# Patient Record
Sex: Female | Born: 1970 | Race: White | Hispanic: No | Marital: Married | Smoking: Former smoker
Health system: Southern US, Community
[De-identification: ages and names within clinical notes are randomized; demographics above are authoritative.]

## PROBLEM LIST (undated history)

## (undated) DIAGNOSIS — S069X9A Unspecified intracranial injury with loss of consciousness of unspecified duration, initial encounter: Secondary | ICD-10-CM

## (undated) DIAGNOSIS — I499 Cardiac arrhythmia, unspecified: Secondary | ICD-10-CM

## (undated) DIAGNOSIS — S069XAA Unspecified intracranial injury with loss of consciousness status unknown, initial encounter: Secondary | ICD-10-CM

## (undated) DIAGNOSIS — R42 Dizziness and giddiness: Secondary | ICD-10-CM

## (undated) DIAGNOSIS — F329 Major depressive disorder, single episode, unspecified: Secondary | ICD-10-CM

## (undated) DIAGNOSIS — F419 Anxiety disorder, unspecified: Secondary | ICD-10-CM

## (undated) DIAGNOSIS — F32A Depression, unspecified: Secondary | ICD-10-CM

## (undated) HISTORY — PX: CHOLECYSTECTOMY: SHX55

## (undated) HISTORY — PX: ABDOMINAL HYSTERECTOMY: SHX81

---

## 2018-11-20 DIAGNOSIS — M7062 Trochanteric bursitis, left hip: Secondary | ICD-10-CM | POA: Diagnosis not present

## 2018-11-20 DIAGNOSIS — M25552 Pain in left hip: Secondary | ICD-10-CM | POA: Diagnosis not present

## 2018-12-02 DIAGNOSIS — F331 Major depressive disorder, recurrent, moderate: Secondary | ICD-10-CM | POA: Diagnosis not present

## 2018-12-11 DIAGNOSIS — B349 Viral infection, unspecified: Secondary | ICD-10-CM | POA: Diagnosis not present

## 2018-12-23 DIAGNOSIS — F3181 Bipolar II disorder: Secondary | ICD-10-CM | POA: Diagnosis not present

## 2018-12-25 ENCOUNTER — Other Ambulatory Visit: Payer: Self-pay

## 2018-12-25 ENCOUNTER — Emergency Department (HOSPITAL_COMMUNITY): Payer: BLUE CROSS/BLUE SHIELD

## 2018-12-25 ENCOUNTER — Encounter (HOSPITAL_COMMUNITY): Payer: Self-pay

## 2018-12-25 ENCOUNTER — Emergency Department (HOSPITAL_COMMUNITY)
Admission: EM | Admit: 2018-12-25 | Discharge: 2018-12-25 | Disposition: A | Discharge status: Home / Self Care | Payer: BLUE CROSS/BLUE SHIELD | Attending: Emergency Medicine | Admitting: Emergency Medicine

## 2018-12-25 DIAGNOSIS — Z87891 Personal history of nicotine dependence: Secondary | ICD-10-CM | POA: Insufficient documentation

## 2018-12-25 DIAGNOSIS — R0602 Shortness of breath: Secondary | ICD-10-CM | POA: Diagnosis not present

## 2018-12-25 DIAGNOSIS — Z79899 Other long term (current) drug therapy: Secondary | ICD-10-CM | POA: Insufficient documentation

## 2018-12-25 DIAGNOSIS — R0789 Other chest pain: Secondary | ICD-10-CM | POA: Diagnosis not present

## 2018-12-25 DIAGNOSIS — F329 Major depressive disorder, single episode, unspecified: Secondary | ICD-10-CM | POA: Diagnosis not present

## 2018-12-25 DIAGNOSIS — Z9049 Acquired absence of other specified parts of digestive tract: Secondary | ICD-10-CM | POA: Insufficient documentation

## 2018-12-25 DIAGNOSIS — R079 Chest pain, unspecified: Secondary | ICD-10-CM | POA: Diagnosis not present

## 2018-12-25 HISTORY — DX: Unspecified intracranial injury with loss of consciousness status unknown, initial encounter: S06.9XAA

## 2018-12-25 HISTORY — DX: Cardiac arrhythmia, unspecified: I49.9

## 2018-12-25 HISTORY — DX: Major depressive disorder, single episode, unspecified: F32.9

## 2018-12-25 HISTORY — DX: Unspecified intracranial injury with loss of consciousness of unspecified duration, initial encounter: S06.9X9A

## 2018-12-25 HISTORY — DX: Depression, unspecified: F32.A

## 2018-12-25 LAB — CBC
HCT: 47.4 % — ABNORMAL HIGH (ref 36.0–46.0)
HEMOGLOBIN: 14.6 g/dL (ref 12.0–15.0)
MCH: 28.2 pg (ref 26.0–34.0)
MCHC: 30.8 g/dL (ref 30.0–36.0)
MCV: 91.5 fL (ref 80.0–100.0)
NRBC: 0 % (ref 0.0–0.2)
Platelets: 310 10*3/uL (ref 150–400)
RBC: 5.18 MIL/uL — AB (ref 3.87–5.11)
RDW: 13.2 % (ref 11.5–15.5)
WBC: 8.3 10*3/uL (ref 4.0–10.5)

## 2018-12-25 LAB — BASIC METABOLIC PANEL
ANION GAP: 9 (ref 5–15)
BUN: 16 mg/dL (ref 6–20)
CO2: 29 mmol/L (ref 22–32)
Calcium: 9.4 mg/dL (ref 8.9–10.3)
Chloride: 104 mmol/L (ref 98–111)
Creatinine, Ser: 0.74 mg/dL (ref 0.44–1.00)
GFR calc Af Amer: >60 mL/min (ref >60)
GFR calc non Af Amer: >60 mL/min (ref >60)
Glucose, Bld: 93 mg/dL (ref 70–99)
POTASSIUM: 3.6 mmol/L (ref 3.5–5.1)
Sodium: 142 mmol/L (ref 135–145)

## 2018-12-25 LAB — POCT I-STAT TROPONIN I
TROPONIN I, POC: 0.01 ng/mL (ref 0.00–0.08)
Troponin i, poc: 0 ng/mL (ref 0.00–0.08)

## 2018-12-25 MED ORDER — ALUM & MAG HYDROXIDE-SIMETH 200-200-20 MG/5ML PO SUSP
30.0000 mL | Freq: Once | ORAL | Status: AC
  Administered 2018-12-25: 30 mL via ORAL
  Filled 2018-12-25: qty 30

## 2018-12-25 MED ORDER — LIDOCAINE VISCOUS HCL 2 % MT SOLN
15.0000 mL | Freq: Once | OROMUCOSAL | Status: AC
  Administered 2018-12-25: 15 mL via ORAL
  Filled 2018-12-25: qty 15

## 2018-12-25 MED ORDER — SODIUM CHLORIDE 0.9% FLUSH: 3.0000 mL | Freq: Once | INTRAVENOUS | Status: DC

## 2018-12-25 NOTE — ED Provider Notes (Signed)
[facility]-EMERGENCY DEPT Provider Note   CSN: [id number] Arrival date & time: 12/25/18  1123    History   Chief Complaint Chief Complaint  Patient presents with  . Chest Pain    HPI Annette Spears is a 48 y.o. female.     48 year old female with prior medical history as detailed below presents for evaluation of chest pressure.  Patient reports onset of vague chest pressure starting this morning around 9 AM.  She denies associated diaphoresis, shortness of breath, back pain, fever, or other acute complaint.  She denies prior history of cardiac disease.  She denies prior cardiac work-up.  The history is provided by the patient and medical records.  Chest Pain  Pain location:  Substernal area Pain quality: tightness   Pain radiates to:  Does not radiate Pain severity:  No pain Onset quality:  Gradual Duration:  5 hours Timing:  Constant Progression:  Unchanged Chronicity:  New Worsened by:  Nothing Ineffective treatments:  None tried   Past Medical History:  Diagnosis Date  . Depression   . Irregular heart beat   . TBI (traumatic brain injury) (HCC)     There are no active problems to display for this patient.   Past Surgical History:  Procedure Laterality Date  . ABDOMINAL HYSTERECTOMY    . CHOLECYSTECTOMY       OB History   No obstetric history on file.      Home Medications    Prior to Admission medications   Medication Sig Start Date End Date Taking? Authorizing Provider  ibuprofen (ADVIL,MOTRIN) 200 MG tablet Take 800-1,400 mg by mouth 2 (two) times daily as needed for moderate pain.   Yes [provider]  LATUDA 40 MG TABS tablet Take 40 mg by mouth. 12/02/18  Yes [provider]  PAXIL 40 MG tablet Take 40 mg by mouth daily. 12/23/18  Yes [provider]  traZODone (DESYREL) 100 MG tablet Take 100-200 mg by mouth at bedtime as needed for sleep. 12/02/18  Yes [provider]  WELLBUTRIN XL 150 MG  24 hr tablet Take 450 mg by mouth daily. 12/02/18  Yes [provider]    Family History Family History  Adopted: Yes    Social History Social History   Tobacco Use  . Smoking status: Former Smoker    Types: Cigarettes  . Smokeless tobacco: Never Used  Substance Use Topics  . Alcohol use: Not Currently  . Drug use: Not Currently     Allergies   Geodon [ziprasidone hcl]; Lithium; and Risperdal [risperidone]   Review of Systems Review of Systems  Cardiovascular: Positive for chest pain.  All other systems reviewed and are negative.    Physical Exam Updated Vital Signs BP (!) 112/53   Pulse 77   Temp 98.1 F (36.7 C) (Oral)   Resp 13   Ht 4\' 11"  (1.499 m)   Wt 131.5 kg   LMP 12/25/2018   SpO2 95%   BMI 58.57 kg/m   Physical Exam Vitals signs and nursing note reviewed.  Constitutional:      General: She is not in acute distress.    Appearance: She is well-developed.  HENT:     Head: Normocephalic and atraumatic.  Eyes:     Conjunctiva/sclera: Conjunctivae normal.     Pupils: Pupils are equal, round, and reactive to light.  Neck:     Musculoskeletal: Normal range of motion and neck supple.  Cardiovascular:     Rate and  Rhythm: Normal rate and regular rhythm.     Heart sounds: Normal heart sounds.  Pulmonary:     Effort: Pulmonary effort is normal. No respiratory distress.     Breath sounds: Normal breath sounds.  Abdominal:     General: There is no distension.     Palpations: Abdomen is soft.     Tenderness: There is no abdominal tenderness.  Musculoskeletal: Normal range of motion.        General: No deformity.  Skin:    General: Skin is warm and dry.  Neurological:     Mental Status: She is alert and oriented to person, place, and time.      ED Treatments / Results  Labs (all labs ordered are listed, but only abnormal results are displayed) Labs Reviewed  CBC - Abnormal; Notable for the following components:      Result Value    RBC 5.18 (*)    HCT 47.4 (*)    All other components within normal limits  BASIC METABOLIC PANEL  I-STAT TROPONIN, ED  POCT I-STAT TROPONIN I    EKG EKG Interpretation  Date/Time:  Monday December 25 2018 11:31:07 EDT Ventricular Rate:  73 PR Interval:    QRS Duration: 80 QT Interval:  390 QTC Calculation: 430 R Axis:   57 Text Interpretation:  Sinus rhythm Borderline T wave abnormalities Baseline wander in lead(s) V1 Confirmed by Kristine Royal [phone]) on 12/25/2018 12:03:19 PM   Radiology Dg Chest 2 View  Result Date: 12/25/2018 CLINICAL DATA:  Chest pain and shortness of breath EXAM: CHEST - 2 VIEW COMPARISON:  None. FINDINGS: Lungs are clear. Heart size and pulmonary vascularity are normal. No adenopathy. No bone lesions. IMPRESSION: No edema or consolidation. Electronically Signed   By: Bretta Bang III M.D.   On: 12/25/2018 12:27    Procedures Procedures (including critical care time)  Medications Ordered in ED Medications  sodium chloride flush (NS) 0.9 % injection 3 mL (0 mLs Intravenous Hold 12/25/18 1244)     Initial Impression / Assessment and Plan / ED Course  I have reviewed the triage vital signs and the nursing notes.  Pertinent labs & imaging results that were available during my care of the patient were reviewed by me and considered in my medical decision making (see chart for details).        MDM  Screen complete  Heart Score of 2  Is presenting for evaluation of chest discomfort that began this morning.  Initial EKG is without evidence of acute ischemia.  Initial troponin is negative.  Patient does feel improved following her initial ED evaluation.  Other screening labs obtained are without significant abnormality.  Delta troponin is negative.   BMP is still pending - Staff may have to redraw specimen.  Patient likely will be appropriate for discharge home.  Dr. Deretha Emory aware of case and pending disposition.     Final Clinical  Impressions(s) / ED Diagnoses   Final diagnoses:  Atypical chest pain    ED Discharge Orders    None       Wynetta Fines, MD 12/25/18 1600

## 2018-12-25 NOTE — ED Provider Notes (Signed)
BMP is normal.  No significant abnormalities.  Patient stable for discharge home.  Troponins x2 were negative.   Vanetta Mulders, MD 12/25/18 1640

## 2018-12-25 NOTE — Discharge Instructions (Addendum)
Please return for any problem. Follow up with your regular physician as instructed.  °

## 2018-12-25 NOTE — ED Triage Notes (Signed)
Patient c/o left chest pain and SOB since 0900 today. paitent states the pain radiates into the left arm and states her teeth even hurt.

## 2019-01-13 DIAGNOSIS — F251 Schizoaffective disorder, depressive type: Secondary | ICD-10-CM | POA: Diagnosis not present

## 2019-02-10 DIAGNOSIS — N3001 Acute cystitis with hematuria: Secondary | ICD-10-CM | POA: Diagnosis not present

## 2019-02-10 DIAGNOSIS — R300 Dysuria: Secondary | ICD-10-CM | POA: Diagnosis not present

## 2019-02-11 DIAGNOSIS — N3001 Acute cystitis with hematuria: Secondary | ICD-10-CM | POA: Diagnosis not present

## 2019-02-17 DIAGNOSIS — F251 Schizoaffective disorder, depressive type: Secondary | ICD-10-CM | POA: Diagnosis not present

## 2019-03-06 DIAGNOSIS — F331 Major depressive disorder, recurrent, moderate: Secondary | ICD-10-CM | POA: Diagnosis not present

## 2019-03-13 ENCOUNTER — Emergency Department (HOSPITAL_COMMUNITY)
Admission: EM | Admit: 2019-03-13 | Discharge: 2019-03-14 | Disposition: A | Discharge status: Other Acute Inpatient Hospital | Payer: BC Managed Care – PPO | Attending: Emergency Medicine | Admitting: Emergency Medicine

## 2019-03-13 ENCOUNTER — Encounter (HOSPITAL_COMMUNITY): Payer: Self-pay

## 2019-03-13 DIAGNOSIS — Z79899 Other long term (current) drug therapy: Secondary | ICD-10-CM | POA: Insufficient documentation

## 2019-03-13 DIAGNOSIS — Z87891 Personal history of nicotine dependence: Secondary | ICD-10-CM | POA: Diagnosis not present

## 2019-03-13 DIAGNOSIS — Z62810 Personal history of physical and sexual abuse in childhood: Secondary | ICD-10-CM | POA: Diagnosis not present

## 2019-03-13 DIAGNOSIS — Z03818 Encounter for observation for suspected exposure to other biological agents ruled out: Secondary | ICD-10-CM | POA: Diagnosis not present

## 2019-03-13 DIAGNOSIS — Z818 Family history of other mental and behavioral disorders: Secondary | ICD-10-CM | POA: Diagnosis not present

## 2019-03-13 DIAGNOSIS — Z1159 Encounter for screening for other viral diseases: Secondary | ICD-10-CM | POA: Insufficient documentation

## 2019-03-13 DIAGNOSIS — F333 Major depressive disorder, recurrent, severe with psychotic symptoms: Secondary | ICD-10-CM | POA: Diagnosis not present

## 2019-03-13 DIAGNOSIS — Z87820 Personal history of traumatic brain injury: Secondary | ICD-10-CM | POA: Diagnosis not present

## 2019-03-13 DIAGNOSIS — R45851 Suicidal ideations: Secondary | ICD-10-CM | POA: Diagnosis not present

## 2019-03-13 DIAGNOSIS — F329 Major depressive disorder, single episode, unspecified: Secondary | ICD-10-CM | POA: Insufficient documentation

## 2019-03-13 DIAGNOSIS — Z008 Encounter for other general examination: Secondary | ICD-10-CM | POA: Insufficient documentation

## 2019-03-13 DIAGNOSIS — Z888 Allergy status to other drugs, medicaments and biological substances status: Secondary | ICD-10-CM | POA: Diagnosis not present

## 2019-03-13 LAB — ETHANOL: Alcohol, Ethyl (B): <10 mg/dL (ref <10)

## 2019-03-13 LAB — CBC
HCT: 46.5 % — ABNORMAL HIGH (ref 36.0–46.0)
Hemoglobin: 15.2 g/dL — ABNORMAL HIGH (ref 12.0–15.0)
MCH: 28.8 pg (ref 26.0–34.0)
MCHC: 32.7 g/dL (ref 30.0–36.0)
MCV: 88.1 fL (ref 80.0–100.0)
Platelets: 340 10*3/uL (ref 150–400)
RBC: 5.28 MIL/uL — ABNORMAL HIGH (ref 3.87–5.11)
RDW: 13.4 % (ref 11.5–15.5)
WBC: 9.6 10*3/uL (ref 4.0–10.5)
nRBC: 0 % (ref 0.0–0.2)

## 2019-03-13 LAB — COMPREHENSIVE METABOLIC PANEL
ALT: 27 U/L (ref 0–44)
AST: 23 U/L (ref 15–41)
Albumin: 3.9 g/dL (ref 3.5–5.0)
Alkaline Phosphatase: 97 U/L (ref 38–126)
Anion gap: 9 (ref 5–15)
BUN: 18 mg/dL (ref 6–20)
CO2: 25 mmol/L (ref 22–32)
Calcium: 9.6 mg/dL (ref 8.9–10.3)
Chloride: 108 mmol/L (ref 98–111)
Creatinine, Ser: 0.95 mg/dL (ref 0.44–1.00)
GFR calc Af Amer: >60 mL/min (ref >60)
GFR calc non Af Amer: >60 mL/min (ref >60)
Glucose, Bld: 96 mg/dL (ref 70–99)
Potassium: 4.1 mmol/L (ref 3.5–5.1)
Sodium: 142 mmol/L (ref 135–145)
Total Bilirubin: 0.4 mg/dL (ref 0.3–1.2)
Total Protein: 7.5 g/dL (ref 6.5–8.1)

## 2019-03-13 LAB — ACETAMINOPHEN LEVEL: Acetaminophen (Tylenol), Serum: <10 ug/mL — ABNORMAL LOW (ref 10–30)

## 2019-03-13 LAB — SALICYLATE LEVEL: Salicylate Lvl: <7 mg/dL (ref 2.8–30.0)

## 2019-03-13 LAB — I-STAT BETA HCG BLOOD, ED (MC, WL, AP ONLY): I-stat hCG, quantitative: <5 m[IU]/mL (ref <5)

## 2019-03-13 NOTE — ED Triage Notes (Signed)
PT arrives POV for eval of SI. Pt reports the last two weeks she has been having increased AVH. Pt reports that she has been hearing voices while trying to do her homework that tell her that she is "stupid or dumb" and can't do it. Pt w/ hx of suicide attempts last one about 6 months ago. Pt reports plan of sticking hand into garbage disposal, allowing the garbage disposal to cut her hand off and then bleeding out from there." Pt alert, cooperative and pleasant in triage.

## 2019-03-14 ENCOUNTER — Encounter (HOSPITAL_COMMUNITY): Payer: Self-pay | Admitting: *Deleted

## 2019-03-14 ENCOUNTER — Inpatient Hospital Stay (HOSPITAL_COMMUNITY)
Admission: AD | Admit: 2019-03-14 | Discharge: 2019-03-17 | DRG: 885 | Disposition: A | Discharge status: Home / Self Care | Payer: BLUE CROSS/BLUE SHIELD | Source: Intra-hospital | Attending: Psychiatry | Admitting: Psychiatry

## 2019-03-14 ENCOUNTER — Other Ambulatory Visit: Payer: Self-pay

## 2019-03-14 DIAGNOSIS — Z818 Family history of other mental and behavioral disorders: Secondary | ICD-10-CM

## 2019-03-14 DIAGNOSIS — Z87891 Personal history of nicotine dependence: Secondary | ICD-10-CM | POA: Diagnosis not present

## 2019-03-14 DIAGNOSIS — Z008 Encounter for other general examination: Secondary | ICD-10-CM | POA: Diagnosis not present

## 2019-03-14 DIAGNOSIS — Z888 Allergy status to other drugs, medicaments and biological substances status: Secondary | ICD-10-CM

## 2019-03-14 DIAGNOSIS — F333 Major depressive disorder, recurrent, severe with psychotic symptoms: Secondary | ICD-10-CM | POA: Diagnosis not present

## 2019-03-14 DIAGNOSIS — Z20828 Contact with and (suspected) exposure to other viral communicable diseases: Secondary | ICD-10-CM | POA: Diagnosis not present

## 2019-03-14 DIAGNOSIS — Z79899 Other long term (current) drug therapy: Secondary | ICD-10-CM

## 2019-03-14 DIAGNOSIS — Z62810 Personal history of physical and sexual abuse in childhood: Secondary | ICD-10-CM | POA: Diagnosis present

## 2019-03-14 DIAGNOSIS — Z87820 Personal history of traumatic brain injury: Secondary | ICD-10-CM

## 2019-03-14 DIAGNOSIS — R45851 Suicidal ideations: Secondary | ICD-10-CM | POA: Diagnosis not present

## 2019-03-14 DIAGNOSIS — F332 Major depressive disorder, recurrent severe without psychotic features: Secondary | ICD-10-CM | POA: Insufficient documentation

## 2019-03-14 DIAGNOSIS — R440 Auditory hallucinations: Secondary | ICD-10-CM | POA: Diagnosis present

## 2019-03-14 DIAGNOSIS — F329 Major depressive disorder, single episode, unspecified: Secondary | ICD-10-CM | POA: Diagnosis not present

## 2019-03-14 HISTORY — DX: Anxiety disorder, unspecified: F41.9

## 2019-03-14 LAB — RAPID URINE DRUG SCREEN, HOSP PERFORMED
Amphetamines: NOT DETECTED
Barbiturates: NOT DETECTED
Benzodiazepines: NOT DETECTED
Cocaine: NOT DETECTED
Opiates: NOT DETECTED
Tetrahydrocannabinol: NOT DETECTED

## 2019-03-14 LAB — SARS CORONAVIRUS 2 BY RT PCR (HOSPITAL ORDER, PERFORMED IN ~~LOC~~ HOSPITAL LAB): SARS Coronavirus 2: NEGATIVE

## 2019-03-14 MED ORDER — HYDROXYZINE HCL 25 MG PO TABS: 25.0000 mg | ORAL_TABLET | Freq: Three times a day (TID) | ORAL | Status: DC | PRN

## 2019-03-14 MED ORDER — ACETAMINOPHEN 325 MG PO TABS
650.0000 mg | ORAL_TABLET | Freq: Four times a day (QID) | ORAL | Status: DC | PRN
  Administered 2019-03-14 – 2019-03-16 (×3): 650 mg via ORAL
  Filled 2019-03-14 (×3): qty 2

## 2019-03-14 MED ORDER — ALUM & MAG HYDROXIDE-SIMETH 200-200-20 MG/5ML PO SUSP: 30.0000 mL | ORAL | Status: DC | PRN

## 2019-03-14 MED ORDER — TRAZODONE HCL 50 MG PO TABS: 50.0000 mg | ORAL_TABLET | Freq: Every evening | ORAL | Status: DC | PRN

## 2019-03-14 MED ORDER — LURASIDONE HCL 80 MG PO TABS
80.0000 mg | ORAL_TABLET | Freq: Every day | ORAL | Status: DC
  Administered 2019-03-14 – 2019-03-16 (×3): 80 mg via ORAL
  Filled 2019-03-14 (×5): qty 1

## 2019-03-14 MED ORDER — BUPROPION HCL ER (XL) 150 MG PO TB24
450.0000 mg | ORAL_TABLET | Freq: Every day | ORAL | Status: DC
  Administered 2019-03-14 – 2019-03-17 (×4): 450 mg via ORAL
  Filled 2019-03-14 (×7): qty 3

## 2019-03-14 MED ORDER — MIRTAZAPINE 7.5 MG PO TABS
7.5000 mg | ORAL_TABLET | Freq: Every day | ORAL | Status: DC
  Administered 2019-03-14 – 2019-03-16 (×3): 7.5 mg via ORAL
  Filled 2019-03-14 (×5): qty 1

## 2019-03-14 MED ORDER — MAGNESIUM HYDROXIDE 400 MG/5ML PO SUSP: 30.0000 mL | Freq: Every day | ORAL | Status: DC | PRN

## 2019-03-14 NOTE — Tx Team (Signed)
Interdisciplinary Treatment and Diagnostic Plan Update  03/14/2019 Time of Session: 9:00am Annette Spears MRN: [id number]  Principal Diagnosis: <principal problem not specified>  Secondary Diagnoses: Active Problems:   Severe recurrent major depression without psychotic features (HCC)   Current Medications:  Current Facility-Administered Medications  Medication Dose Route Frequency Provider Last Rate Last Dose  . acetaminophen (TYLENOL) tablet 650 mg  650 mg Oral Q6H PRN Rozetta Nunnery, NP      . alum & mag hydroxide-simeth (MAALOX/MYLANTA) 200-200-20 MG/5ML suspension 30 mL  30 mL Oral Q4H PRN Lindon Romp A, NP      . hydrOXYzine (ATARAX/VISTARIL) tablet 25 mg  25 mg Oral TID PRN Lindon Romp A, NP      . magnesium hydroxide (MILK OF MAGNESIA) suspension 30 mL  30 mL Oral Daily PRN Lindon Romp A, NP      . traZODone (DESYREL) tablet 50 mg  50 mg Oral QHS PRN Rozetta Nunnery, NP       PTA Medications: Medications Prior to Admission  Medication Sig Dispense Refill Last Dose  . ibuprofen (ADVIL,MOTRIN) 200 MG tablet Take 800-1,400 mg by mouth 2 (two) times daily as needed for moderate pain.   Past Month at Unknown time  . LATUDA 60 MG TABS Take 1 tablet by mouth at bedtime.     Marland Kitchen PARoxetine (PAXIL) 20 MG tablet Take 1 tablet by mouth daily.     . traZODone (DESYREL) 100 MG tablet Take 100-200 mg by mouth at bedtime as needed for sleep.   12/24/2018 at Unknown time  . WELLBUTRIN XL 150 MG 24 hr tablet Take 450 mg by mouth daily.   12/25/2018 at Unknown time    Patient Stressors:    Patient Strengths:    Treatment Modalities: Medication Management, Group therapy, Case management,  1 to 1 session with clinician, Psychoeducation, Recreational therapy.   Physician Treatment Plan for Primary Diagnosis: <principal problem not specified> Long Term Goal(s):     Short Term Goals:    Medication Management: Evaluate patient's response, side effects, and tolerance of medication  regimen.  Therapeutic Interventions: 1 to 1 sessions, Unit Group sessions and Medication administration.  Evaluation of Outcomes: Not Met  Physician Treatment Plan for Secondary Diagnosis: Active Problems:   Severe recurrent major depression without psychotic features ([place])  Long Term Goal(s):     Short Term Goals:       Medication Management: Evaluate patient's response, side effects, and tolerance of medication regimen.  Therapeutic Interventions: 1 to 1 sessions, Unit Group sessions and Medication administration.  Evaluation of Outcomes: Not Met   RN Treatment Plan for Primary Diagnosis: <principal problem not specified> Long Term Goal(s): Knowledge of disease and therapeutic regimen to maintain health will improve  Short Term Goals: Ability to remain free from injury will improve, Ability to verbalize feelings will improve and Ability to identify and develop effective coping behaviors will improve  Medication Management: RN will administer medications as ordered by provider, will assess and evaluate patient's response and provide education to patient for prescribed medication. RN will report any adverse and/or side effects to prescribing provider.  Therapeutic Interventions: 1 on 1 counseling sessions, Psychoeducation, Medication administration, Evaluate responses to treatment, Monitor vital signs and CBGs as ordered, Perform/monitor CIWA, COWS, AIMS and Fall Risk screenings as ordered, Perform wound care treatments as ordered.  Evaluation of Outcomes: Not Met   LCSW Treatment Plan for Primary Diagnosis: <principal problem not specified> Long Term Goal(s): Safe transition to appropriate next level  of care at discharge, Engage patient in therapeutic group addressing interpersonal concerns.  Short Term Goals: Engage patient in aftercare planning with referrals and resources, Increase social support, Identify triggers associated with mental health/substance abuse issues and  Increase skills for wellness and recovery  Therapeutic Interventions: Assess for all discharge needs, 1 to 1 time with Social worker, Explore available resources and support systems, Assess for adequacy in community support network, Educate family and significant other(s) on suicide prevention, Complete Psychosocial Assessment, Interpersonal group therapy.  Evaluation of Outcomes: Not Met   Progress in Treatment: Attending groups: No. New to unit. Participating in groups: No. Taking medication as prescribed: Yes. Toleration medication: Yes. Family/Significant other contact made: No, will contact:  husband Patient understands diagnosis: Yes. Discussing patient identified problems/goals with staff: Yes. Medical problems stabilized or resolved: Yes. Denies suicidal/homicidal ideation: No. Issues/concerns per patient self-inventory: Yes.  New problem(s) identified: Yes, Describe:  financial stressors  New Short Term/Long Term Goal(s): medication management for mood stabilization; elimination of SI thoughts; development of comprehensive mental wellness/sobriety plan.  Patient Goals: "To get better"  Discharge Plan or Barriers: Expected to discharge home, will be referred to [facility] for therapy and medication management.  Reason for Continuation of Hospitalization: Anxiety Depression Suicidal ideation  Estimated Length of Stay: 5-7 days  Attendees: Patient: Annette Spears  03/14/2019 10:06 AM  Physician:  03/14/2019 10:06 AM  Nursing:  03/14/2019 10:06 AM  RN Care Manager: 03/14/2019 10:06 AM  Social Worker: Stephanie Acre, [place] 03/14/2019 10:06 AM  Recreational Therapist:  03/14/2019 10:06 AM  Other:  03/14/2019 10:06 AM  Other:  03/14/2019 10:06 AM  Other: 03/14/2019 10:06 AM    Scribe for Treatment Team: Joellen [place], [place] 03/14/2019 10:06 AM

## 2019-03-14 NOTE — ED Provider Notes (Signed)
[facility] EMERGENCY DEPARTMENT Provider Note   CSN: [id number] Arrival date & time: 03/13/19  2229    History   Chief Complaint Chief Complaint  Patient presents with  . Suicidal    HPI Annette Spears is a 48 y.o. female.     Patient with history of depression and bipolar disorder presents the emergency department with complaint of suicidal ideation.  Patient states that she wants to cut her wrists and let herself bleed.  She reports taking her medication but having increasing auditory hallucinations and depression.  She states that she last cut her wrist 2 months ago.  She states that this helps with her pain.  She denies any recent symptoms of illness including fevers, chest pain, shortness of breath, nausea, vomiting.  Denies alcohol or drug use.     Past Medical History:  Diagnosis Date  . Depression   . Irregular heart beat   . TBI (traumatic brain injury) (HCC)     There are no active problems to display for this patient.   Past Surgical History:  Procedure Laterality Date  . ABDOMINAL HYSTERECTOMY    . CHOLECYSTECTOMY       OB History   No obstetric history on file.      Home Medications    Prior to Admission medications   Medication Sig Start Date End Date Taking? Authorizing Provider  ibuprofen (ADVIL,MOTRIN) 200 MG tablet Take 800-1,400 mg by mouth 2 (two) times daily as needed for moderate pain.    [provider]  LATUDA 40 MG TABS tablet Take 40 mg by mouth. 12/02/18   [provider]  PAXIL 40 MG tablet Take 40 mg by mouth daily. 12/23/18   [provider]  traZODone (DESYREL) 100 MG tablet Take 100-200 mg by mouth at bedtime as needed for sleep. 12/02/18   [provider]  WELLBUTRIN XL 150 MG 24 hr tablet Take 450 mg by mouth daily. 12/02/18   [provider]    Family History Family History  Adopted: Yes    Social History Social History   Tobacco Use  . Smoking status: Former  Smoker    Types: Cigarettes  . Smokeless tobacco: Never Used  Substance Use Topics  . Alcohol use: Not Currently  . Drug use: Not Currently     Allergies   Geodon [ziprasidone hcl]; Lithium; and Risperdal [risperidone]   Review of Systems Review of Systems  Constitutional: Negative for fever.  HENT: Negative for rhinorrhea and sore throat.   Eyes: Negative for redness.  Respiratory: Negative for cough.   Cardiovascular: Negative for chest pain.  Gastrointestinal: Negative for abdominal pain, diarrhea, nausea and vomiting.  Genitourinary: Negative for dysuria.  Musculoskeletal: Negative for myalgias.  Skin: Negative for rash.  Neurological: Negative for headaches.  Psychiatric/Behavioral: Positive for hallucinations and suicidal ideas.     Physical Exam Updated Vital Signs BP 107/80 (BP Location: Right Arm)   Pulse 87   Resp 18   Ht 4\' 11"  (1.499 m)   Wt 131 kg   LMP 12/25/2018   SpO2 97%   BMI 58.33 kg/m   Physical Exam Vitals signs and nursing note reviewed.  Constitutional:      Appearance: She is well-developed.  HENT:     Head: Normocephalic and atraumatic.  Eyes:     General:        Right eye: No discharge.        Left eye: No discharge.     Conjunctiva/sclera:  Conjunctivae normal.  Neck:     Musculoskeletal: Normal range of motion and neck supple.  Cardiovascular:     Rate and Rhythm: Normal rate and regular rhythm.     Heart sounds: Normal heart sounds.  Pulmonary:     Effort: Pulmonary effort is normal.     Breath sounds: Normal breath sounds.  Abdominal:     Palpations: Abdomen is soft.     Tenderness: There is no abdominal tenderness.  Skin:    General: Skin is warm and dry.  Neurological:     General: No focal deficit present.     Mental Status: She is alert.  Psychiatric:        Mood and Affect: Mood is depressed.        Speech: Speech normal.        Thought Content: Thought content includes suicidal ideation. Thought content does not  include homicidal ideation. Thought content includes suicidal plan. Thought content does not include homicidal plan.      ED Treatments / Results  Labs (all labs ordered are listed, but only abnormal results are displayed) Labs Reviewed  ACETAMINOPHEN LEVEL - Abnormal; Notable for the following components:      Result Value   Acetaminophen (Tylenol), Serum <10 (*)    All other components within normal limits  CBC - Abnormal; Notable for the following components:   RBC 5.28 (*)    Hemoglobin 15.2 (*)    HCT 46.5 (*)    All other components within normal limits  SARS CORONAVIRUS 2 (HOSPITAL ORDER, PERFORMED IN [facility] [place] [facility])  COMPREHENSIVE METABOLIC PANEL  ETHANOL  SALICYLATE LEVEL  RAPID URINE DRUG SCREEN, HOSP PERFORMED  I-STAT BETA HCG BLOOD, ED ([facility], [facility], [facility] ONLY)    EKG None  Radiology No results found.  Procedures Procedures (including critical care time)  Medications Ordered in ED Medications - No data to display   Initial Impression / Assessment and Plan / ED Course  I have reviewed the triage vital signs and the nursing notes.  Pertinent labs & imaging results that were available during my care of the patient were reviewed by me and considered in my medical decision making (see chart for details).        Patient seen and examined.    Vital signs reviewed and are as follows: BP 107/80 (BP Location: Right Arm)   Pulse 87   Resp 18   Ht 4\' 11"  (1.499 m)   Wt 131 kg   LMP 12/25/2018   SpO2 97%   BMI 58.33 kg/m   Patient seen and evaluated.  Labs reviewed.  Patient is medically cleared, [facility]  TTS consult pending.  4:28 AM [facility]  Dr. Jama Flavorsobos accepting.  COVID swab ordered.  Final Clinical Impressions(s) / ED Diagnoses   Final diagnoses:  Suicidal ideation   To behavioral health.   ED Discharge Orders    None       Renne CriglerGeiple, Anahis Furgeson, Cordelia Poche-C 03/14/19 [id number]    Zadie RhineWickline,  Donald, MD 03/14/19 [phone]
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
## 2019-03-14 NOTE — BH Assessment (Addendum)
Tele Assessment Note   Patient Name: Annette Spears MRN: [id number] Referring Physician: Renne Crigler Location of Patient: [facility] Location of Provider: Behavioral Health TTS Department  Tryniti Losano is an 48 y.o. female presenting with auditory hallucinations and SI with plan to stick hand in garbage disposal and bleed out. Onset of voices was 2 weeks ago while trying to do her homework, voices are calling her stupid and dumb and that she can't do it. Patient stated these voices belong to her sister and mother. Patient reports school is her stressor, in school for GED. Patient has history of suicide attempts last one about 6 months ago. Patient denied inpatient mental health treatment. Patient last cut her wrist 2 months ago, she said it helps with her pain. Patient reported increased depressive symptoms. Patient denied HI and drug/alcohol usage. No current outpatient mental health services received.   Patient resides with husband and 49 year old son. Patient reported being unemployed. Patient is in school completing her GED. Patient reported good relationship with family.   Patient was pleasant and cooperative during assessment. Patient mood and affect was depressed. Patient was alert and oriented x4. Patient displayed coherent and relevant thought processes. Patient displayed fair eye contact and logical/coherent speech.   Collateral Contact: Zariah Gluckman, husband, [phone] or [phone]. Unable to make contact at this time.  ETOH negative UDS negative  Diagnosis: Major depressive disorder  Past Medical History:  Past Medical History:  Diagnosis Date  . Depression   . Irregular heart beat   . TBI (traumatic brain injury) [facility])     Past Surgical History:  Procedure Laterality Date  . ABDOMINAL HYSTERECTOMY    . CHOLECYSTECTOMY      Family History:  Family History  Adopted: Yes    Social History:  reports that she has quit smoking. Her smoking use included cigarettes.  She has never used smokeless tobacco. She reports previous alcohol use. She reports previous drug use.  Additional Social History:  Alcohol / Drug Use Pain Medications: see MAR Prescriptions: see MAR Over the Counter: see MAR  CIWA: CIWA-Ar BP: 107/80 Pulse Rate: 87 COWS:    Allergies:  Allergies  Allergen Reactions  . Geodon [Ziprasidone Hcl]   . Lithium   . Risperdal [Risperidone]     Home Medications: (Not in a hospital admission)   OB/GYN Status:  Patient's last menstrual period was 12/25/2018.  General Assessment Data Location of Assessment: [facility] ED TTS Assessment: In system Is this a Tele or Face-to-Face Assessment?: Tele Assessment Is this an Initial Assessment or a Re-assessment for this encounter?: Initial Assessment Patient Accompanied by:: N/A Language Other than English: No Living Arrangements: (family home) What gender do you identify as?: Female Marital status: Married Engineer, agricultural: (with husband and 47 y/o son) Can pt return to current living arrangement?: Yes Admission Status: Voluntary Is patient capable of signing voluntary admission?: Yes Referral Source: Self/Family/Friend     Crisis Care Plan Living Arrangements: (with husband and 81 y/o son) Legal Guardian: (self) Name of Psychiatrist: (none) Name of Therapist: (none)  Education Status Is patient currently in school?: Yes Current Grade: (GED program) Highest grade of school patient has completed: (unknown) 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 : Yes Suicidal Intent: Yes-Currently Present Has patient had any suicidal intent within the past 6 months prior to admission? : Yes Is  patient at risk for suicide?: Yes Suicidal Plan?: Yes-Currently Present Has patient had any suicidal plan within the past 6 months prior to admission? :  Yes Specify Current Suicidal Plan: (stick hand in garbage disposal) Access to Means: Yes Specify Access to Suicidal Means: (garbage disposal in home) What has been your use of drugs/alcohol within the last 12 months?: (denied) Previous Attempts/Gestures: No How many times?: (0) Other Self Harm Risks: (denied) Triggers for Past Attempts: (n/a) Intentional Self Injurious Behavior: None Family Suicide History: No Recent stressful life event(s): ("can't get my work right") Persecutory voices/beliefs?: No Depression: Yes Depression Symptoms: Tearfulness, Fatigue, Guilt, Loss of interest in usual pleasures, Feeling worthless/self pity Substance abuse history and/or treatment for substance abuse?: No Suicide prevention information given to non-admitted patients: Not applicable  Risk to Others within the past 6 months Homicidal Ideation: No Does patient have any lifetime risk of violence toward others beyond the six months prior to admission? : No Thoughts of Harm to Others: No Current Homicidal Intent: No Current Homicidal Plan: No Access to Homicidal Means: No Identified Victim: (n/a) History of harm to others?: No Assessment of Violence: None Noted Violent Behavior Description: (n/a) Does patient have access to weapons?: No Criminal Charges Pending?: No Does patient have a court date: No Is patient on probation?: No  Psychosis Hallucinations: Auditory Delusions: None noted  Mental Status Report Appearance/Hygiene: Unremarkable Eye Contact: Fair Motor Activity: Freedom of movement Speech: Logical/coherent Level of Consciousness: Alert Mood: Depressed Affect: Depressed Anxiety Level: Minimal Thought Processes: Coherent, Relevant Judgement: Partial Orientation: Person, Place, Time, Situation Obsessive Compulsive Thoughts/Behaviors: None  Cognitive Functioning Concentration: Good Memory: Recent Intact Is patient IDD: No Insight: Poor Impulse Control: Poor Appetite:  Good Have you had any weight changes? : No Change Sleep: No Change Total Hours of Sleep: (7) Vegetative Symptoms: None  ADLScreening [facility](BHH Assessment Services) Patient's cognitive ability adequate to safely complete daily activities?: Yes Patient able to express need for assistance with ADLs?: Yes Independently performs ADLs?: Yes (appropriate for developmental age)  Prior Inpatient Therapy Prior Inpatient Therapy: No  Prior Outpatient Therapy Prior Outpatient Therapy: No Does patient have an ACCT team?: No Does patient have Intensive In-House Services?  : No Does patient have [facility] services? : No Does patient have P4CC services?: No  ADL Screening (condition at time of admission) Patient's cognitive ability adequate to safely complete daily activities?: Yes Patient able to express need for assistance with ADLs?: Yes Independently performs ADLs?: Yes (appropriate for developmental age)  Merchant navy officerAdvance Directives (For Healthcare) Does Patient Have a Medical Advance Directive?: No Would patient like information on creating a medical advance directive?: No - Patient declined    Disposition:  Disposition Initial Assessment Completed for this Encounter: Yes  Nira ConnJason Berry, NP, patient meets inpatient criteria. Selena BattenKim, RN, [facility]. Attending is Dr. Jama Flavorsobos. RN informed of acceptance. Report call [phone].  This service was provided via telemedicine using a 2-way, interactive audio and video technology.  Names of all persons participating in this telemedicine service and their role in this encounter. Name: Tarshia Lamore Role: Patient  Name: Al CorpusLatisha Calah Gershman, [facility] Role: TTS Clinician  Name:  Role:   Name:  Role:     Burnetta SabinLatisha D Keionte Swicegood, [facility] 03/14/2019 4:18 AM

## 2019-03-14 NOTE — BHH Suicide Risk Assessment (Signed)
[facility]   Nursing information obtained from:   patient and chart  Demographic factors:   Annette Spears, married, two children, in college Current Mental Status:   see below Loss Factors:    Historical Factors:   history of mood disorder, history of substance abuse ( alcohol, opiates ) currently in early recovery Risk Reduction Factors:     Total Time spent with patient: 45 minutes Principal Problem:  Diagnosis:  Active Problems:   Severe recurrent major depression without psychotic features (HCC)  Subjective Data:   Continued Clinical Symptoms:    The "Alcohol Use Disorders Identification Test", Guidelines for Use in Primary Care, Second Edition.  World Science writerHealth Organization [facility](WHO). Score between 0-7:  no or low risk or alcohol related problems. Score between 8-15:  moderate risk of alcohol related problems. Score between 16-19:  high risk of alcohol related problems. Score 20 or above:  warrants further diagnostic evaluation for alcohol dependence and treatment.   CLINICAL FACTORS:  [id number], married, has two children ( 25,15), lives with husband and youngest child, currently in RN school.  Patient states she presented to ED voluntarily with suicidal thoughts of " sticking my hand in the garbage disposal" and bleeding to death. States this is due to auditory hallucinations. States " it's like I would do it to get rid of the voices". Hallucinations tend to increase when she feels that she did not do well on a test or quiz ( currently in college) .  Reports history of auditory hallucinations . States she hears voice of mother and a sister, telling her that " I am a failure and am not going to amount to anything". Hallucinations tend to increase when she feels that she did not do well on a test or quiz ( currently in college)  Describes these as chronic , persistent . Of note, patient notes that in general she has been doing a lot better over recent months. States she stopped  using drugs and alcohol last year, has worked on being healthier, has stopped smoking cigarettes and has started going to college, but reports frustration that hallucinations have persisted . Describes some neuro-vegetative symptoms - denies anhedonia, reports some sadness, low energy level.  History of alcohol and opiate abuse, now sober x 7 months.  Denies medical illnesses . She reports allergy to Li, Risperidone, Geodon ( unspecified ) . She also reports poor tolerance to Lamictal trial in the past , due to " blurry vision". States past Abilify trial was stopped due to weight gain. Quit smoking last year. History of prior psychiatric admissions, most recently last year for year. She reports history of depression, states she has been diagnosed with Bipolar Disorder in the past, but states that she feels hypomanic type episodes were substance induced and improved /subsided when she became sober . Current medications- Paxil 20 mgrs QDAY , Wellbutrin XL 450 mgrs QDAY , Latuda 60 mgrs QDAY , Trazodone 100 mgrs QHS. Denies side effects, and states she feels they help partially.  Patient states that biological mother and a brother have history of Bipolar Disorder. Father and mother have history of alcohol/substance abuse .   Dx- MDD with psychotic features . 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  Plan - Inpatient admission We discussed options- patient prefers to continue Latuda, as has been well tolerated and has not been associated with weight gain. She also reports that Wellbutrin XL has been effective. She prefers to taper off Paxil,  as it has caused some side effects, weight gain. Agrees to Remeron as antidepressant augmentation and for insomnia. Side effects, including risk of sedation, weight gain, reviewed. Wellbutin XL 450 mgrs QDAY  Latuda 80 mgrs QDAY  Remeron 7.5 mgrs QHS D/C Paxil and Trazodone Check TSH, Lipid Panel, HgbA1C.       Musculoskeletal: Strength &  Muscle Tone: within normal limits Gait & Station: normal Patient leans: N/A  Psychiatric Specialty Exam: Physical Exam  ROS reports headache, no chest pain, no shortness of breath, no dyspnea, no cough, no vomiting, no fever, no chills   Blood pressure 105/73, pulse (!) 108, temperature 98.3 F (36.8 C), temperature source Oral, resp. rate 20, height 4\' 11"  (1.499 m), weight 96.6 kg, last menstrual period 12/25/2018.Body mass index is 43.02 kg/m.  General Appearance: Well Groomed  Eye Contact:  Good  Speech:  Normal Rate  Volume:  Normal  Mood:  reports some depression but states she is feeling better today, and describes mood as 6-7/10 with 10 being best   Affect:  constricted, but improves during session and smiles appropriately at times   Thought Process:  Linear and Descriptions of Associations: Intact  Orientation:  Full (Time, Place, and Person)  Thought Content:  (+) auditory hallucinations, currently does not appear internally preoccupied, no delusions are expressed   Suicidal Thoughts:  No denies any suicidal or self injurious ideations at this time , and contracts for safety, denies HI or violent ideations  Homicidal Thoughts:  No  Memory:  recent and remote grossly intact   Judgement:  Fair  Insight:  Fair  Psychomotor Activity:  Normal  Concentration:  Concentration: Good and Attention Span: Good  Recall:  Good  Fund of Knowledge:  Good  Language:  Good  Akathisia:  Negative  Handed:  Right  AIMS (if indicated):     Assets:  Communication Skills Desire for Improvement Resilience  ADL's:  Intact  Cognition:  WNL  Sleep:         COGNITIVE FEATURES THAT CONTRIBUTE TO RISK:  Closed-mindedness and Loss of executive function    SUICIDE RISK:   Moderate:  Frequent suicidal ideation with limited intensity, and duration, some specificity in terms of plans, no associated intent, good self-control, limited dysphoria/symptomatology, some risk factors present, and  identifiable protective factors, including available and accessible social support.  PLAN OF CARE: Patient will be admitted to inpatient psychiatric unit for stabilization and safety. Will provide and encourage milieu participation. Provide medication management and maked adjustments as needed.  Will follow daily.    I certify that inpatient services furnished can reasonably be expected to improve the patient's condition.   Craige Cotta, MD 03/14/2019, 10:11 AM

## 2019-03-14 NOTE — Progress Notes (Signed)
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
## 2019-03-14 NOTE — ED Notes (Signed)
RN informed pt of acceptance to [facility]. RN gave pt opportunity to call her husband to inform him where she is being transferred. Pt states "he is still asleep I dont want to bother him."

## 2019-03-14 NOTE — Plan of Care (Signed)
Nurse discussed anxiety, depression and coping skills with patient.  
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
## 2019-03-14 NOTE — Tx Team (Signed)
Initial Treatment Plan 03/14/2019 2:44 PM Jacinda Manganiello PZW:[id number]    PATIENT STRESSORS: Educational concerns Medication change or noncompliance Occupational concerns Substance abuse   PATIENT STRENGTHS: Ability for insight Average or above average intelligence Capable of independent living Metallurgist fund of knowledge Motivation for treatment/growth Physical Health Supportive family/friends   PATIENT IDENTIFIED PROBLEMS: "coping skills "  "depression"  "auditory hallucinations"                  DISCHARGE CRITERIA:  Ability to meet basic life and health needs Adequate post-discharge living arrangements Improved stabilization in mood, thinking, and/or behavior Medical problems require only outpatient monitoring Motivation to continue treatment in a less acute level of care Need for constant or close observation no longer present Reduction of life-threatening or endangering symptoms to within safe limits Safe-care adequate arrangements made Verbal commitment to aftercare and medication compliance  PRELIMINARY DISCHARGE PLAN: Attend aftercare/continuing care group Attend PHP/IOP Attend 12-step recovery group Outpatient therapy Participate in family therapy Return to previous living arrangement Return to previous work or school arrangements  PATIENT/FAMILY INVOLVEMENT: This treatment plan has been presented to and reviewed with the patient, Nezzie Schlechter.  The patient and family have been given the opportunity to ask questions and make suggestions.  Quintella Reichert [place], [place] 03/14/2019, 2:44 PM

## 2019-03-14 NOTE — ED Notes (Signed)
Nira Conn, NP, patient meets inpatient criteria.  Selena Batten RN, [facility], accepted at [facility] Adult Unit Room 401- Bed 1.  Attending is Dr. Jama Flavors.  Windell Moulding, RN, informed of acceptance.  Report call [phone].

## 2019-03-15 LAB — HEMOGLOBIN A1C
Hgb A1c MFr Bld: 5 % (ref 4.8–5.6)
Mean Plasma Glucose: 96.8 mg/dL

## 2019-03-15 LAB — TSH: TSH: 1.63 u[IU]/mL (ref 0.350–4.500)

## 2019-03-15 LAB — LIPID PANEL
Cholesterol: 215 mg/dL — ABNORMAL HIGH (ref 0–200)
HDL: 56 mg/dL (ref >40)
LDL Cholesterol: 150 mg/dL — ABNORMAL HIGH (ref 0–99)
Total CHOL/HDL Ratio: 3.8 RATIO
Triglycerides: 45 mg/dL (ref <150)
VLDL: 9 mg/dL (ref 0–40)
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
## 2019-03-15 NOTE — BHH Suicide Risk Assessment (Signed)
B[facility] INPATIENT:  Family/Significant Other Suicide Prevention Education  Suicide Prevention Education:  Education Completed; Reygan Eveland, husband, [phone]  has been identified by the patient as the family member/significant other with whom the patient will be residing, and identified as the person(s) who will aid the patient in the event of a mental health crisis (suicidal ideations/suicide attempt).  With written consent from the patient, the family member/significant other has been provided the following suicide prevention education, prior to the and/or following the discharge of the patient.  The suicide prevention education provided includes the following:  Suicide risk factors  Suicide prevention and interventions  National Suicide Hotline telephone number  [facility] assessment telephone number  [facility] Emergency Assistance 911  [facility] and/or Residential Mobile Crisis Unit telephone number  Request made of family/significant other to:  Remove weapons (e.g., guns, rifles, knives), all items previously/currently identified as safety concern.    Remove drugs/medications (over-the-counter, prescriptions, illicit drugs), all items previously/currently identified as a safety concern.  The family member/significant other verbalizes understanding of the suicide prevention education information provided.  The family member/significant other agrees to remove the items of safety concern listed above.  Husband confirms there are no guns or weapons in the home. He does acknowledge that the patient voiced SI with a plan to put her hand in the garbage disposal, and that this hazard is not something he is able to remove from their apartment.  Husband reports that patient does have a prior history of suicide attempts, but he questions the severity of the attempts, noting a previous suicide attempt in which the patient cut her wrist horizontally rather than  vertically. He shares that patient has had several prior behavioral health hospitalizations in other cities and states.  Husband shares that the patient has always struggled with low self-esteem and low-self worth. Her family always told her she was "slow and wouldn't do anything in life." Husband shares that the patient is intelligent, but has trouble processing information by reading. He shares that due to COVID 19, the patient has had to switch to online GED classes, which requires her to read information. This has been particularly frustrating for the patient.  Husband is looking forward to the patient discharging, he reports she is very open about her depression and SI.  Darreld Mclean 03/15/2019, 2:55 PM

## 2019-03-16 MED ORDER — IBUPROFEN 800 MG PO TABS
800.0000 mg | ORAL_TABLET | Freq: Three times a day (TID) | ORAL | Status: DC | PRN
  Administered 2019-03-16: 800 mg via ORAL
  Filled 2019-03-16: qty 1

## 2019-03-16 MED ORDER — IBUPROFEN 800 MG PO TABS: 800.0000 mg | ORAL_TABLET | Freq: Four times a day (QID) | ORAL | Status: DC | PRN

## 2019-03-16 MED ORDER — ONDANSETRON HCL 4 MG PO TABS
4.0000 mg | ORAL_TABLET | Freq: Three times a day (TID) | ORAL | Status: DC | PRN
  Administered 2019-03-16: 14:00:00 4 mg via ORAL
  Filled 2019-03-16: qty 1

## 2019-03-16 MED ORDER — IBUPROFEN 400 MG PO TABS
400.0000 mg | ORAL_TABLET | Freq: Four times a day (QID) | ORAL | Status: DC | PRN
  Administered 2019-03-16: 16:00:00 400 mg via ORAL
  Filled 2019-03-16: qty 1

## 2019-03-16 NOTE — Progress Notes (Signed)
D Patient is observed OOB  UAL on hte 400 hall today she tolerates this well. She remains quiet, flat and depressed. She avoids eye contact and she does not forward to this Clinical research associate.     A She completed her daily assessment and on tis she wrote she denied SI today and she rated her depression, hopelessness and anxeity " 0/0/0/", respectively.     R Safeyt in place.

## 2019-03-16 NOTE — Progress Notes (Signed)
D   Pt is seen in the dayroom interacting with others   She is watching TV and eating a snack   She complained of a headache which she called a migrane and said she needs more ibuprophen  A   Verbal support given   Medications administered and effectiveness monitored    Q 15 min checks   Received orders to increase ibuprophen and she received same  R    Pt is safe and verbalized relief
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
## 2019-03-16 NOTE — Plan of Care (Signed)
  Problem: Education: Goal: Emotional status will improve Outcome: Progressing   

## 2019-03-16 NOTE — Progress Notes (Signed)
[place] NOVEL CORONAVIRUS (COVID-19) DAILY CHECK-OFF SYMPTOMS - answer yes or no to each - every day NO YES  Have you had a fever in the past 24 hours?  . Fever (Temp > 37.80C / 100F) X   Have you had any of these symptoms in the past 24 hours? . New Cough .  Sore Throat  .  Shortness of Breath .  Difficulty Breathing .  Unexplained Body Aches   X   Have you had any one of these symptoms in the past 24 hours not related to allergies?   . Runny Nose .  Nasal Congestion .  Sneezing   X   If you have had runny nose, nasal congestion, sneezing in the past 24 hours, has it worsened?  X   EXPOSURES - check yes or no X   Have you traveled outside the state in the past 14 days?  X   Have you been in contact with someone with a confirmed diagnosis of COVID-19 or PUI in the past 14 days without wearing appropriate PPE?  X   Have you been living in the same home as a person with confirmed diagnosis of COVID-19 or a PUI (household contact)?    X   Have you been diagnosed with COVID-19?    X              What to do next: Answered NO to all: Answered YES to anything:   Proceed with unit schedule Follow the [facility] Inpatient Flowsheet.   

## 2019-03-16 NOTE — Progress Notes (Signed)
D: Pt was in bed in her room upon initial approach.  Pt presents with depressed affect and mood.  She reports her day was "good."  Her goal is to "get better.  I want to get out tomorrow."  Pt reports she feels safe to discharge tomorrow.  Pt denies SI/HI, denies hallucinations, denies pain.  Pt has been visible in milieu interacting with peers and staff appropriately.    A: Introduced self to pt.  Met with pt 1:1.  Actively listened to pt and offered support and encouragement.  Medications administered per order.  Q15 minute safety checks maintained.  R: Pt is safe on the unit.  Pt is compliant with medications.  Pt verbally contracts for safety.  Will continue to monitor and assess.

## 2019-03-16 NOTE — [facility] Group Notes (Signed)
LCSW Group Therapy Note 03/16/2019 12:16 PM  Type of Therapy and Topic: Group Therapy: Overcoming Obstacles  Participation Level: Did Not Attend  Description of Group:  In this group patients will be encouraged to explore what they see as obstacles to their own wellness and recovery. They will be guided to discuss their thoughts, feelings, and behaviors related to these obstacles. The group will process together ways to cope with barriers, with attention given to specific choices patients can make. Each patient will be challenged to identify changes they are motivated to make in order to overcome their obstacles. This group will be process-oriented, with patients participating in exploration of their own experiences as well as giving and receiving support and challenge from other group members.  Therapeutic Goals: 1. Patient will identify personal and current obstacles as they relate to admission. 2. Patient will identify barriers that currently interfere with their wellness or overcoming obstacles.  3. Patient will identify feelings, thought process and behaviors related to these barriers. 4. Patient will identify two changes they are willing to make to overcome these obstacles:   Summary of Patient Progress  Invited, chose not to attend.    Therapeutic Modalities:  Cognitive Behavioral Therapy Solution Focused Therapy Motivational Interviewing Relapse Prevention Therapy   Graceyn Fodor LCSWA Clinical Social Worker   

## 2019-03-16 NOTE — Progress Notes (Signed)
Did not attend group 

## 2019-03-17 MED ORDER — LURASIDONE HCL 80 MG PO TABS: 80.0000 mg | ORAL_TABLET | Freq: Every day | ORAL | 0 refills | Status: AC

## 2019-03-17 MED ORDER — MIRTAZAPINE 7.5 MG PO TABS: 7.5000 mg | ORAL_TABLET | Freq: Every day | ORAL | 0 refills | Status: DC

## 2019-03-17 NOTE — [facility] Suicide Risk Assessment (Addendum)
[facility]   Principal Problem: Severe episode of recurrent major depressive disorder, with psychotic features ([facility]) Discharge Diagnoses: Principal Problem:   Severe episode of recurrent major depressive disorder, with psychotic features ([facility])   Total Time spent with patient: 30 minutes  Musculoskeletal: Strength & Muscle Tone: within normal limits Gait & Station: normal Patient leans: N/A  Psychiatric Specialty Exam: ROS reports improved headache, denies chest pain, no shortness of breath, no cough, no vomiting , no rash, no fever  Blood pressure (!) 114/92, pulse 71, temperature (!) 97.5 F (36.4 C), temperature source Oral, resp. rate 20, height 4\' 11"  (1.499 m), weight 96.6 kg, last menstrual period 12/25/2018.Body mass index is 43.02 kg/m.  General Appearance: improved grooming   Eye Contact::  Good  Speech:  Normal Rate409  Volume:  Normal  Mood:  reported as improved, states " I am feeling a lot better"  Affect:  Appropriate and reactive  Thought Process:  Linear and Descriptions of Associations: Intact  Orientation:  Full (Time, Place, and Person)  Thought Content:  no hallucinations, no delusions, not internally preoccupied at this time- reports auditory hallucinations have resolved and has not had any x 2 days  Suicidal Thoughts:  No currently denies suicidal or self injurious ideations, denies homicidal or violent ideations  Homicidal Thoughts:  No  Memory:  recent and remote grossly intact   Judgement:  Other:  improved   Insight:  improved   Psychomotor Activity:  Normal  Concentration:  Good  Recall:  Good  Fund of Knowledge:Good  Language: Good  Akathisia:  Negative  Handed:  Right  AIMS (if indicated):   no abnormal movements noted or reported   Assets:  Communication Skills Desire for Improvement Resilience  Sleep:  Number of Hours: 6.75  Cognition: WNL  ADL's:  Intact   Mental Status Per Nursing Assessment::   On  Admission:  NA  Demographic Factors:  [age], married, has two children ( 25,15), lives with husband and youngest child, currently in [employer] school.   Loss Factors: Academic challenges / chronic auditory hallucinations  Historical Factors: Reports long history of auditory hallucinations, reported as demeaning and hypercritical. History of Bipolar Disorder diagnosis in the past , history of prior psychiatric admissions   Risk Reduction Factors:   Sense of responsibility to family, Living with another person, especially a relative and Positive coping skills or problem solving skills  Continued Clinical Symptoms:  At present is alert, attentive, well related, calm, describes mood as improved, minimizes depression at this time, affect is appropriate, reactive, no thought disorder, denies hallucinations and states auditory hallucinations have resolved, does not appear internally preoccupied, no delusions expressed. Denies SI or HI, future oriented, invested in continuing education to become an [employer]. Behavior on unit calm, in good control. Denies medication side effects at this time , we reviewed med side effect profile, including risk of seizures on Wellbutrin ,of metabolic and movement disorders on Latuda , and potential for sedation, importance of not driving if sedated   Cognitive Features That Contribute To Risk:  No gross cognitive deficits noted upon discharge. Is alert , attentive, and oriented x 3   Suicide Risk:  Mild:  Suicidal ideation of limited frequency, intensity, duration, and specificity.  There are no identifiable plans, no associated intent, mild dysphoria and related symptoms, good self-control (both objective and subjective assessment), few other risk factors, and identifiable protective factors, including available and accessible social support.  Follow-up Information    Long,  Ricke Hey, MD Follow up on 03/28/2019.   Specialty:  Psychiatry Why:  Medication management with Dr. Jacqulyn Bath is  Wednesday, 6/10 at 12:00p.  Appointment will be done virtually and Dr. Jacqulyn Bath will contact you.  Contact information: [address] [place] [address] [phone]        [facility] Follow up on 03/22/2019.   Why:  Therapy appointment with Alyssa is Thursday, 6/4 at 3:00p.  Please call within 24 hours of discharge to confirm appointment and pay $20 deposit.  Contact information: [address] [place] [address] [phone]           Plan Of Care/Follow-up recommendations:  Activity:  as tolerated  Diet:  regular Tests:  NA Other:  See below  Patient is expressing readiness for discharge, requesting to discharge- no grounds for involuntary commitment She is leaving in good spirits , plans to return home Follow up as above, also follow up with PCP for medical management as needed   Craige Cotta, MD 03/17/2019, 10:23 AM

## 2019-03-17 NOTE — Progress Notes (Signed)
Pt completed her daily assessment and on it she wrote she denied SI today and she rated her depression, hopelessness and anxiety "0/0/0/", respectively. She was given her dc instructions and she stated verbal understanding and willingness to comply. She is given cc od instrucitons ( SRA, AVS, SSP and trnasition record) and then all bleongings were returned to her per protocol. She was escorted to bldg entranced and dc'd ambulatory home to the care of her husband.
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
## 2019-03-17 NOTE — Progress Notes (Signed)
Adult Psychoeducational Group Note  Date:  03/17/2019 Time:  9:30am-10:00am  Group Topic/Focus:  Goals Group:   The focus of this group is to help patients establish daily goals to achieve during treatment and discuss how the patient can incorporate goal setting into their daily lives to aide in recovery.  Participation Level:  Active  Participation Quality:  Appropriate and Attentive  Affect:  Appropriate  Cognitive:  Alert, Appropriate and Oriented  Insight: Appropriate and Good  Engagement in Group:  Engaged  Modes of Intervention:  Discussion and Support  Additional Comments:  Pt informed the group that her goal for the day is to interact with others. Patient informed the group that she plans to accomplish this goal by talking to others . Patient informed the group that she would rate her day as a 7, but reports that she could shift to an 8 when her husband comes to pick her up. Patient informed the group that during her time in the hospital she learned that she is not by herself in the world.   Annye Asa 03/17/2019, 12:15 PM

## 2019-03-17 NOTE — Progress Notes (Signed)
[place] NOVEL CORONAVIRUS (COVID-19) DAILY CHECK-OFF SYMPTOMS - answer yes or no to each - every day NO YES  Have you had a fever in the past 24 hours?  . Fever (Temp > 37.80C / 100F) X   Have you had any of these symptoms in the past 24 hours? . New Cough .  Sore Throat  .  Shortness of Breath .  Difficulty Breathing .  Unexplained Body Aches   X   Have you had any one of these symptoms in the past 24 hours not related to allergies?   . Runny Nose .  Nasal Congestion .  Sneezing   X   If you have had runny nose, nasal congestion, sneezing in the past 24 hours, has it worsened?  X   EXPOSURES - check yes or no X   Have you traveled outside the state in the past 14 days?  X   Have you been in contact with someone with a confirmed diagnosis of COVID-19 or PUI in the past 14 days without wearing appropriate PPE?  X   Have you been living in the same home as a person with confirmed diagnosis of COVID-19 or a PUI (household contact)?    X   Have you been diagnosed with COVID-19?    X              What to do next: Answered NO to all: Answered YES to anything:   Proceed with unit schedule Follow the [facility] Inpatient Flowsheet.   

## 2019-03-17 NOTE — Plan of Care (Signed)
  Problem: Education: Goal: Knowledge of [facility] Education information/materials will improve Outcome: Progressing Goal: Emotional status will improve Outcome: Progressing Goal: Mental status will improve Outcome: Progressing   

## 2019-03-17 NOTE — [facility] Group Notes (Signed)
LCSW Group Therapy Note  03/17/2019       Type of Therapy and Topic:  Group Therapy: "My Mental Health"  Participation Level:  Active   Description of Group:   In this group, patients were asked four questions in order to generate discussion around the idea of mental illness and/or substance addiction being medical problems: 1. In one sentence describe the current state of your mental health or substance use. 2. How much do you feel similar to or different from others? 3. Do you tend to identify with other people or compare yourself to them?  4. In a word or sentence, share what you desire your mental health/substance use to be.  Discussion was held that led to the conclusion that comparing ourselves to others is not healthy, but identifying with the elements of their issues that are similar to ours is helpful.    Therapeutic Goals: 1. Patients will identify their feelings about their current mental health/substance use problems. 2. Patients will describe how they feel similar to or different from others, and whether they tend to identify with or compare themselves to other people with the same issues. 3. Patients will explore the differences in these concepts and how a change of mindset about mental health/substance use can help with reaching recovery goals. 4. Patients will think about and share what their recovery goals are, in terms of mental health and/or substance use.  Summary of Patient Progress:  The patient shared that the state of her mental health is good with some anxiety, but glad to be getting out of the hospital today due to missing her family.  She talked about how someone at church that she went to and asked for prayer told her she did not need the medicines.  This leads her to isolate.  We discussed ways to put boundaries in place with people like that.  Her mother also criticizes her for everything, and so she feels different than others.  Therapeutic Modalities:    Processing Motivation Interviewing Psychoeducation  Lynnell Chad, MSW, LCSW 5:59 PM  .

## 2019-03-17 NOTE — Progress Notes (Signed)
  [facility] Adult Case Management Discharge Plan :  Will you be returning to the same living situation after discharge:  Yes,  with husband, son At discharge, do you have transportation home?: Yes,  family Do you have the ability to pay for your medications: Yes,  denies having any barriers  Release of information consent forms completed and turned in to Medical Records by CSW.   Patient to Follow up at: Follow-up Information    Long, Ricke Hey, MD Follow up on 03/28/2019.   Specialty:  Psychiatry Why:  Medication management with Dr. Jacqulyn Bath is Wednesday, 6/10 at 12:00p.  Appointment will be done virtually and Dr. Jacqulyn Bath will contact you.  Contact information: [address] [place] [address] [phone]        [facility] Follow up on 03/22/2019.   Why:  Therapy appointment with Alyssa is Thursday, 6/4 at 3:00p.  Please call within 24 hours of discharge to confirm appointment and pay $20 deposit.  Contact information: [address] [place] [address] [phone]           Next level of care provider has access to [facility] Link:no  Safety Planning and Suicide Prevention discussed: Yes,  with husband  Have you used any form of tobacco in the last 30 days? (Cigarettes, Smokeless Tobacco, Cigars, and/or Pipes): No  Has patient been referred to the Quitline?: N/A patient is not a smoker  Patient has been referred for addiction treatment: N/A  Lynnell Chad, LCSW 03/17/2019, 9:04 AM

## 2019-03-28 DIAGNOSIS — F3181 Bipolar II disorder: Secondary | ICD-10-CM | POA: Diagnosis not present

## 2019-04-13 ENCOUNTER — Other Ambulatory Visit (HOSPITAL_COMMUNITY): Payer: Self-pay | Admitting: Family

## 2019-04-14 DIAGNOSIS — F250 Schizoaffective disorder, bipolar type: Secondary | ICD-10-CM | POA: Diagnosis not present

## 2019-04-28 DIAGNOSIS — F251 Schizoaffective disorder, depressive type: Secondary | ICD-10-CM | POA: Diagnosis not present

## 2019-05-25 DIAGNOSIS — R51 Headache: Secondary | ICD-10-CM | POA: Diagnosis not present

## 2019-05-26 DIAGNOSIS — F251 Schizoaffective disorder, depressive type: Secondary | ICD-10-CM | POA: Diagnosis not present

## 2019-06-09 DIAGNOSIS — F3181 Bipolar II disorder: Secondary | ICD-10-CM | POA: Diagnosis not present

## 2019-06-11 DIAGNOSIS — M25552 Pain in left hip: Secondary | ICD-10-CM | POA: Diagnosis not present

## 2019-06-11 DIAGNOSIS — M79662 Pain in left lower leg: Secondary | ICD-10-CM | POA: Diagnosis not present

## 2019-06-12 DIAGNOSIS — M6281 Muscle weakness (generalized): Secondary | ICD-10-CM | POA: Diagnosis not present

## 2019-06-12 DIAGNOSIS — G5712 Meralgia paresthetica, left lower limb: Secondary | ICD-10-CM | POA: Diagnosis not present

## 2019-06-12 DIAGNOSIS — M7062 Trochanteric bursitis, left hip: Secondary | ICD-10-CM | POA: Diagnosis not present

## 2019-06-28 DIAGNOSIS — M7062 Trochanteric bursitis, left hip: Secondary | ICD-10-CM | POA: Diagnosis not present

## 2019-06-28 DIAGNOSIS — M6281 Muscle weakness (generalized): Secondary | ICD-10-CM | POA: Diagnosis not present

## 2019-06-28 DIAGNOSIS — G5712 Meralgia paresthetica, left lower limb: Secondary | ICD-10-CM | POA: Diagnosis not present

## 2019-06-30 DIAGNOSIS — M25552 Pain in left hip: Secondary | ICD-10-CM | POA: Diagnosis not present

## 2019-07-02 DIAGNOSIS — G5712 Meralgia paresthetica, left lower limb: Secondary | ICD-10-CM | POA: Diagnosis not present

## 2019-07-02 DIAGNOSIS — M7062 Trochanteric bursitis, left hip: Secondary | ICD-10-CM | POA: Diagnosis not present

## 2019-07-02 DIAGNOSIS — M6281 Muscle weakness (generalized): Secondary | ICD-10-CM | POA: Diagnosis not present

## 2019-07-04 DIAGNOSIS — G5712 Meralgia paresthetica, left lower limb: Secondary | ICD-10-CM | POA: Diagnosis not present

## 2019-07-04 DIAGNOSIS — M6281 Muscle weakness (generalized): Secondary | ICD-10-CM | POA: Diagnosis not present

## 2019-07-04 DIAGNOSIS — M7062 Trochanteric bursitis, left hip: Secondary | ICD-10-CM | POA: Diagnosis not present

## 2019-07-07 DIAGNOSIS — F251 Schizoaffective disorder, depressive type: Secondary | ICD-10-CM | POA: Diagnosis not present

## 2019-07-09 DIAGNOSIS — G5712 Meralgia paresthetica, left lower limb: Secondary | ICD-10-CM | POA: Diagnosis not present

## 2019-07-09 DIAGNOSIS — M6281 Muscle weakness (generalized): Secondary | ICD-10-CM | POA: Diagnosis not present

## 2019-07-09 DIAGNOSIS — M7062 Trochanteric bursitis, left hip: Secondary | ICD-10-CM | POA: Diagnosis not present

## 2019-07-10 DIAGNOSIS — M7062 Trochanteric bursitis, left hip: Secondary | ICD-10-CM | POA: Diagnosis not present

## 2019-07-10 DIAGNOSIS — M25552 Pain in left hip: Secondary | ICD-10-CM | POA: Diagnosis not present

## 2019-07-11 DIAGNOSIS — M7062 Trochanteric bursitis, left hip: Secondary | ICD-10-CM | POA: Diagnosis not present

## 2019-07-11 DIAGNOSIS — M6281 Muscle weakness (generalized): Secondary | ICD-10-CM | POA: Diagnosis not present

## 2019-07-11 DIAGNOSIS — G5712 Meralgia paresthetica, left lower limb: Secondary | ICD-10-CM | POA: Diagnosis not present

## 2019-09-01 DIAGNOSIS — F251 Schizoaffective disorder, depressive type: Secondary | ICD-10-CM | POA: Diagnosis not present

## 2019-09-09 DIAGNOSIS — R519 Headache, unspecified: Secondary | ICD-10-CM | POA: Diagnosis not present

## 2019-10-27 DIAGNOSIS — F251 Schizoaffective disorder, depressive type: Secondary | ICD-10-CM | POA: Diagnosis not present

## 2019-11-10 DIAGNOSIS — F251 Schizoaffective disorder, depressive type: Secondary | ICD-10-CM | POA: Diagnosis not present

## 2019-11-12 DIAGNOSIS — Z20822 Contact with and (suspected) exposure to covid-19: Secondary | ICD-10-CM | POA: Diagnosis not present

## 2019-11-12 DIAGNOSIS — Z9189 Other specified personal risk factors, not elsewhere classified: Secondary | ICD-10-CM | POA: Diagnosis not present

## 2019-11-18 DIAGNOSIS — H8111 Benign paroxysmal vertigo, right ear: Secondary | ICD-10-CM | POA: Diagnosis not present

## 2019-12-08 DIAGNOSIS — F251 Schizoaffective disorder, depressive type: Secondary | ICD-10-CM | POA: Diagnosis not present

## 2020-01-13 ENCOUNTER — Emergency Department (HOSPITAL_COMMUNITY): Payer: BC Managed Care – PPO

## 2020-01-13 ENCOUNTER — Other Ambulatory Visit: Payer: Self-pay

## 2020-01-13 ENCOUNTER — Encounter (HOSPITAL_COMMUNITY): Payer: Self-pay | Admitting: Emergency Medicine

## 2020-01-13 ENCOUNTER — Emergency Department (HOSPITAL_COMMUNITY)
Admission: EM | Admit: 2020-01-13 | Discharge: 2020-01-13 | Disposition: A | Discharge status: Home / Self Care | Payer: BC Managed Care – PPO | Attending: Emergency Medicine | Admitting: Emergency Medicine

## 2020-01-13 DIAGNOSIS — R05 Cough: Secondary | ICD-10-CM | POA: Diagnosis not present

## 2020-01-13 DIAGNOSIS — M79602 Pain in left arm: Secondary | ICD-10-CM | POA: Diagnosis not present

## 2020-01-13 DIAGNOSIS — R0789 Other chest pain: Secondary | ICD-10-CM | POA: Insufficient documentation

## 2020-01-13 DIAGNOSIS — Z87891 Personal history of nicotine dependence: Secondary | ICD-10-CM | POA: Diagnosis not present

## 2020-01-13 DIAGNOSIS — R0602 Shortness of breath: Secondary | ICD-10-CM | POA: Diagnosis not present

## 2020-01-13 DIAGNOSIS — Z79899 Other long term (current) drug therapy: Secondary | ICD-10-CM | POA: Diagnosis not present

## 2020-01-13 DIAGNOSIS — R079 Chest pain, unspecified: Secondary | ICD-10-CM

## 2020-01-13 DIAGNOSIS — Z20822 Contact with and (suspected) exposure to covid-19: Secondary | ICD-10-CM | POA: Diagnosis not present

## 2020-01-13 LAB — BASIC METABOLIC PANEL
Anion gap: 10 (ref 5–15)
BUN: 12 mg/dL (ref 6–20)
CO2: 25 mmol/L (ref 22–32)
Calcium: 9.5 mg/dL (ref 8.9–10.3)
Chloride: 106 mmol/L (ref 98–111)
Creatinine, Ser: 0.85 mg/dL (ref 0.44–1.00)
GFR calc Af Amer: >60 mL/min (ref >60)
GFR calc non Af Amer: >60 mL/min (ref >60)
Glucose, Bld: 89 mg/dL (ref 70–99)
Potassium: 4.2 mmol/L (ref 3.5–5.1)
Sodium: 141 mmol/L (ref 135–145)

## 2020-01-13 LAB — CBC
HCT: 47.8 % — ABNORMAL HIGH (ref 36.0–46.0)
Hemoglobin: 15.2 g/dL — ABNORMAL HIGH (ref 12.0–15.0)
MCH: 28.8 pg (ref 26.0–34.0)
MCHC: 31.8 g/dL (ref 30.0–36.0)
MCV: 90.5 fL (ref 80.0–100.0)
Platelets: 339 10*3/uL (ref 150–400)
RBC: 5.28 MIL/uL — ABNORMAL HIGH (ref 3.87–5.11)
RDW: 13.7 % (ref 11.5–15.5)
WBC: 9.2 10*3/uL (ref 4.0–10.5)
nRBC: 0 % (ref 0.0–0.2)

## 2020-01-13 LAB — I-STAT BETA HCG BLOOD, ED (MC, WL, AP ONLY): I-stat hCG, quantitative: <5 m[IU]/mL (ref <5)

## 2020-01-13 LAB — TROPONIN I (HIGH SENSITIVITY)
Troponin I (High Sensitivity): 3 ng/L (ref <18)
Troponin I (High Sensitivity): 3 ng/L (ref <18)

## 2020-01-13 MED ORDER — OXYCODONE-ACETAMINOPHEN 5-325 MG PO TABS
2.0000 | ORAL_TABLET | Freq: Once | ORAL | Status: AC
  Administered 2020-01-13: 2 via ORAL
  Filled 2020-01-13: qty 2

## 2020-01-13 MED ORDER — IOHEXOL 350 MG/ML SOLN
80.0000 mL | Freq: Once | INTRAVENOUS | Status: AC | PRN
  Administered 2020-01-13: 80 mL via INTRAVENOUS

## 2020-01-13 MED ORDER — ONDANSETRON HCL 4 MG/2ML IJ SOLN
4.0000 mg | Freq: Once | INTRAMUSCULAR | Status: AC
  Administered 2020-01-13: 4 mg via INTRAVENOUS
  Filled 2020-01-13: qty 2

## 2020-01-13 MED ORDER — SODIUM CHLORIDE 0.9% FLUSH
3.0000 mL | Freq: Once | INTRAVENOUS | Status: AC
  Administered 2020-01-13: 3 mL via INTRAVENOUS

## 2020-01-13 NOTE — ED Provider Notes (Addendum)
[facility] EMERGENCY DEPARTMENT Provider Note   CSN: [id number] Arrival date & time: 01/13/20  1516     History Chief Complaint  Patient presents with  . Chest Pain    Annette Spears is a 49 y.o. female  HPI 49 year old female presents today with substernal chest pain with some associated left arm pain, shortness of breath, nausea.  She has had a nonproductive cough for 2 weeks.  This pain has been coming and going over the past several days.  She was seen today at [facility] urgent care and chest x-Xerxes Agrusa showed mild widening of the mediastinum.  Secondary to this she was sent to the ED for evaluation.    Past Medical History:  Diagnosis Date  . Anxiety   . Depression   . Irregular heart beat   . TBI (traumatic brain injury) [facility])     Patient Active Problem List   Diagnosis Date Noted  . Severe recurrent major depression without psychotic features (HCC) 03/14/2019  . Severe episode of recurrent major depressive disorder, with psychotic features [facility])     Past Surgical History:  Procedure Laterality Date  . ABDOMINAL HYSTERECTOMY    . CHOLECYSTECTOMY       OB History   No obstetric history on file.     Family History  Adopted: Yes    Social History   Tobacco Use  . Smoking status: Former Smoker    Types: Cigarettes  . Smokeless tobacco: Never Used  Substance Use Topics  . Alcohol use: Not Currently  . Drug use: Not Currently    Home Medications Prior to Admission medications   Medication Sig Start Date End Date Taking? Authorizing Provider  lurasidone (LATUDA) 80 MG TABS tablet Take 1 tablet (80 mg total) by mouth at bedtime. 03/17/19   Oneta Rack, NP  mirtazapine (REMERON) 7.5 MG tablet Take 1 tablet (7.5 mg total) by mouth at bedtime. 03/17/19   Oneta Rack, NP  WELLBUTRIN XL 150 MG 24 hr tablet Take 450 mg by mouth daily. 12/02/18   [provider]    Allergies    Geodon [ziprasidone hcl], Lithium, and Risperdal  [risperidone]  Review of Systems   Review of Systems  All other systems reviewed and are negative.   Physical Exam Updated Vital Signs BP 124/74   Pulse 84   Temp 97.8 F (36.6 C) (Oral)   Resp 20   Ht 1.499 m (4\' 11" )   Wt 99.8 kg   LMP 12/25/2018   SpO2 97%   BMI 44.43 kg/m   Physical Exam Vitals reviewed.  Constitutional:      General: She is not in acute distress.    Appearance: She is well-developed. She is obese. She is not ill-appearing.  Eyes:     Pupils: Pupils are equal, round, and reactive to light.  Cardiovascular:     Rate and Rhythm: Normal rate and regular rhythm.     Pulses:          Carotid pulses are 1+ on the right side and 1+ on the left side.      Radial pulses are 1+ on the right side and 1+ on the left side.       Dorsalis pedis pulses are 1+ on the right side and 1+ on the left side.     Heart sounds: Normal heart sounds.  Pulmonary:     Effort: Pulmonary effort is normal.     Breath sounds: Normal breath sounds.  Abdominal:     Palpations: Abdomen is soft.  Musculoskeletal:        General: Normal range of motion.     Cervical back: Normal range of motion.  Neurological:     Mental Status: She is alert.     ED Results / Procedures / Treatments   Labs (all labs ordered are listed, but only abnormal results are displayed) Labs Reviewed  CBC - Abnormal; Notable for the following components:      Result Value   RBC 5.28 (*)    Hemoglobin 15.2 (*)    HCT 47.8 (*)    All other components within normal limits  BASIC METABOLIC PANEL  I-STAT BETA HCG BLOOD, ED ([facility], [facility], [facility] ONLY)  TROPONIN I (HIGH SENSITIVITY)    EKG EKG Interpretation  Date/Time:  Sunday January 13 2020 15:26:43 EDT Ventricular Rate:  87 PR Interval:  148 QRS Duration: 70 QT Interval:  362 QTC Calculation: 435 R Axis:   30 Text Interpretation: Normal sinus rhythm Nonspecific T wave abnormality Abnormal ECG Confirmed by Margarita Grizzle [phone]) on 01/13/2020 4:14:37  PM   Radiology DG Chest 2 View  Result Date: 01/13/2020 CLINICAL DATA:  Left arm and chest pain. EXAM: CHEST - 2 VIEW COMPARISON:  Chest radiograph 12/25/2018 FINDINGS: The heart size and mediastinal contours are within normal limits. Both lungs are clear. The visualized skeletal structures are unremarkable. IMPRESSION: No active cardiopulmonary disease. Electronically Signed   By: Annia Belt M.D.   On: 01/13/2020 15:59   CT Angio Chest PE W and/or Wo Contrast  Result Date: 01/13/2020 CLINICAL DATA:  Shortness of breath and chest pain, possible mediastinal widening on outpatient chest x-Geneive Sandstrom EXAM: CT ANGIOGRAPHY CHEST WITH CONTRAST TECHNIQUE: Multidetector CT imaging of the chest was performed using the standard protocol during bolus administration of intravenous contrast. Multiplanar CT image reconstructions and MIPs were obtained to evaluate the vascular anatomy. CONTRAST:  53mL OMNIPAQUE IOHEXOL 350 MG/ML SOLN COMPARISON:  Chest x-Koreena Joost from earlier in the same day. FINDINGS: Cardiovascular: Thoracic aorta shows no evidence of aneurysmal dilatation or dissection. The degree of opacification is somewhat limited however. No significant atherosclerotic changes are noted. No cardiac enlargement is seen. No coronary calcifications are noted. The pulmonary artery shows a normal branching pattern. No definitive filling defects are identified to suggest pulmonary emboli. Mediastinum/Nodes: Thoracic inlet is within normal limits. No sizable hilar or mediastinal adenopathy is noted. The esophagus is within normal limits. Lungs/Pleura: Lungs are well aerated bilaterally. No focal confluent infiltrate or sizable effusion is seen. No focal sizable parenchymal nodule is seen. Upper Abdomen: Visualized upper abdomen is unremarkable. Musculoskeletal: Bony structures show no acute abnormality. Mild degenerative changes of the thoracic spine are seen. Review of the MIP images confirms the above findings. IMPRESSION: No  evidence of pulmonary emboli. No acute abnormality seen. Electronically Signed   By: Alcide Clever M.D.   On: 01/13/2020 19:39    Procedures Procedures (including critical care time)  Medications Ordered in ED Medications  sodium chloride flush (NS) 0.9 % injection 3 mL (has no administration in time range)    ED Course  I have reviewed the triage vital signs and the nursing notes.  Pertinent labs & imaging results that were available during my care of the patient were reviewed by me and considered in my medical decision making (see chart for details).    MDM Rules/Calculators/A&P  Patient presents today with chest pain.  She does have family history of coronary artery disease.  No other significant risk factors.  Heart score is 3.  Level index of suspicion for MI today. CT obtained due to mild widening of mediastinum no evidence of mediastinal abnormality.  Additionally there is no evidence of PE.  Patient appears stable for discharge.  We have discussed return precautions and need for patient voices understanding our evaluated here today for chest pain. Heart enzymes are normal indicating a low level suspicion for heart attack today. Additionally had a CT angiogram performed to evaluate the large vessels in your heart and to evaluate for pulmonary embolism.  This study was normal. Please follow-up with your primary care doctor.  Please take an aspirin a day. Return to the emergency department if you are having worsening symptoms especially worsening pain, fever, or other new symptoms  Final Clinical Impression(s) / ED Diagnoses Final diagnoses:  Chest pain, unspecified type    Rx / DC Orders ED Discharge Orders    None       Pattricia Boss, MD 01/13/20 Glorianne Manchester    Pattricia Boss, MD 01/13/20 2003

## 2020-01-13 NOTE — Discharge Instructions (Addendum)
Your evaluated here today for chest pain. Heart enzymes are normal indicating a low level suspicion for heart attack today. Additionally had a CT angiogram performed to evaluate the large vessels in your heart and to evaluate for pulmonary embolism.  This study was normal. Please follow-up with your primary care doctor.  Please take an aspirin a day. Return to the emergency department if you are having worsening symptoms especially worsening pain, fever, or other new symptoms

## 2020-01-13 NOTE — ED Triage Notes (Addendum)
C/o L arm and L chest pain with SOB and nausea since this morning.  Reports non-productive cough x 2 weeks.

## 2020-01-25 DIAGNOSIS — F419 Anxiety disorder, unspecified: Secondary | ICD-10-CM | POA: Diagnosis not present

## 2020-01-25 DIAGNOSIS — R05 Cough: Secondary | ICD-10-CM | POA: Diagnosis not present

## 2020-01-25 DIAGNOSIS — R002 Palpitations: Secondary | ICD-10-CM | POA: Diagnosis not present

## 2020-01-25 DIAGNOSIS — M940 Chondrocostal junction syndrome [Tietze]: Secondary | ICD-10-CM | POA: Diagnosis not present

## 2020-01-25 DIAGNOSIS — F332 Major depressive disorder, recurrent severe without psychotic features: Secondary | ICD-10-CM | POA: Diagnosis not present

## 2020-02-02 DIAGNOSIS — F251 Schizoaffective disorder, depressive type: Secondary | ICD-10-CM | POA: Diagnosis not present

## 2020-02-15 DIAGNOSIS — F332 Major depressive disorder, recurrent severe without psychotic features: Secondary | ICD-10-CM | POA: Diagnosis not present

## 2020-02-15 DIAGNOSIS — R0982 Postnasal drip: Secondary | ICD-10-CM | POA: Diagnosis not present

## 2020-02-16 DIAGNOSIS — F251 Schizoaffective disorder, depressive type: Secondary | ICD-10-CM | POA: Diagnosis not present

## 2020-03-05 DIAGNOSIS — R519 Headache, unspecified: Secondary | ICD-10-CM | POA: Diagnosis not present

## 2020-03-15 DIAGNOSIS — F251 Schizoaffective disorder, depressive type: Secondary | ICD-10-CM | POA: Diagnosis not present

## 2020-04-16 DIAGNOSIS — Z0000 Encounter for general adult medical examination without abnormal findings: Secondary | ICD-10-CM | POA: Diagnosis not present

## 2020-04-16 DIAGNOSIS — Z136 Encounter for screening for cardiovascular disorders: Secondary | ICD-10-CM | POA: Diagnosis not present

## 2020-04-16 DIAGNOSIS — K635 Polyp of colon: Secondary | ICD-10-CM | POA: Diagnosis not present

## 2020-04-16 DIAGNOSIS — Z1329 Encounter for screening for other suspected endocrine disorder: Secondary | ICD-10-CM | POA: Diagnosis not present

## 2020-04-16 DIAGNOSIS — Z1231 Encounter for screening mammogram for malignant neoplasm of breast: Secondary | ICD-10-CM | POA: Diagnosis not present

## 2020-04-16 DIAGNOSIS — N952 Postmenopausal atrophic vaginitis: Secondary | ICD-10-CM | POA: Diagnosis not present

## 2020-04-16 DIAGNOSIS — F332 Major depressive disorder, recurrent severe without psychotic features: Secondary | ICD-10-CM | POA: Diagnosis not present

## 2020-04-18 DIAGNOSIS — Z1231 Encounter for screening mammogram for malignant neoplasm of breast: Secondary | ICD-10-CM | POA: Diagnosis not present

## 2020-04-25 DIAGNOSIS — Z86010 Personal history of colonic polyps: Secondary | ICD-10-CM | POA: Diagnosis not present

## 2020-04-25 DIAGNOSIS — R1310 Dysphagia, unspecified: Secondary | ICD-10-CM | POA: Diagnosis not present

## 2020-04-26 DIAGNOSIS — F251 Schizoaffective disorder, depressive type: Secondary | ICD-10-CM | POA: Diagnosis not present

## 2020-05-08 DIAGNOSIS — F3130 Bipolar disorder, current episode depressed, mild or moderate severity, unspecified: Secondary | ICD-10-CM | POA: Diagnosis not present

## 2020-05-08 DIAGNOSIS — G43919 Migraine, unspecified, intractable, without status migrainosus: Secondary | ICD-10-CM | POA: Diagnosis not present

## 2020-05-08 DIAGNOSIS — R471 Dysarthria and anarthria: Secondary | ICD-10-CM | POA: Diagnosis not present

## 2020-05-08 DIAGNOSIS — F332 Major depressive disorder, recurrent severe without psychotic features: Secondary | ICD-10-CM | POA: Diagnosis not present

## 2020-05-23 DIAGNOSIS — F332 Major depressive disorder, recurrent severe without psychotic features: Secondary | ICD-10-CM | POA: Diagnosis not present

## 2020-05-23 DIAGNOSIS — F3130 Bipolar disorder, current episode depressed, mild or moderate severity, unspecified: Secondary | ICD-10-CM | POA: Diagnosis not present

## 2020-06-09 DIAGNOSIS — Z723 Lack of physical exercise: Secondary | ICD-10-CM | POA: Diagnosis not present

## 2020-06-11 DIAGNOSIS — Z713 Dietary counseling and surveillance: Secondary | ICD-10-CM | POA: Diagnosis not present

## 2020-06-16 DIAGNOSIS — Z86010 Personal history of colonic polyps: Secondary | ICD-10-CM | POA: Diagnosis not present

## 2020-06-16 DIAGNOSIS — K2210 Ulcer of esophagus without bleeding: Secondary | ICD-10-CM | POA: Diagnosis not present

## 2020-06-16 DIAGNOSIS — K2950 Unspecified chronic gastritis without bleeding: Secondary | ICD-10-CM | POA: Diagnosis not present

## 2020-06-16 DIAGNOSIS — R1310 Dysphagia, unspecified: Secondary | ICD-10-CM | POA: Diagnosis not present

## 2020-06-18 DIAGNOSIS — Z7189 Other specified counseling: Secondary | ICD-10-CM | POA: Diagnosis not present

## 2020-06-21 IMAGING — CT CT ANGIO CHEST
3 of 6 series · 14 of 36 positions shown · IV contrast (omnipaque)
Comparison: Chest x-ray from earlier in the same day.

CLINICAL DATA: Shortness of breath and chest pain, possible
mediastinal widening on outpatient chest x-ray

EXAM:
CT ANGIOGRAPHY CHEST WITH CONTRAST
TECHNIQUE: Multidetector CT imaging of the chest was performed using the
standard protocol during bolus administration of intravenous
contrast. Multiplanar CT image reconstructions and MIPs were
obtained to evaluate the vascular anatomy.
CONTRAST:  80mL OMNIPAQUE IOHEXOL 350 MG/ML SOLN

[Series 6: pe 2mm · axial · 0.78mm/px · z∈[+1137,+1283]mm · 5 of 111 slices shown]
[im 19/111  lung]
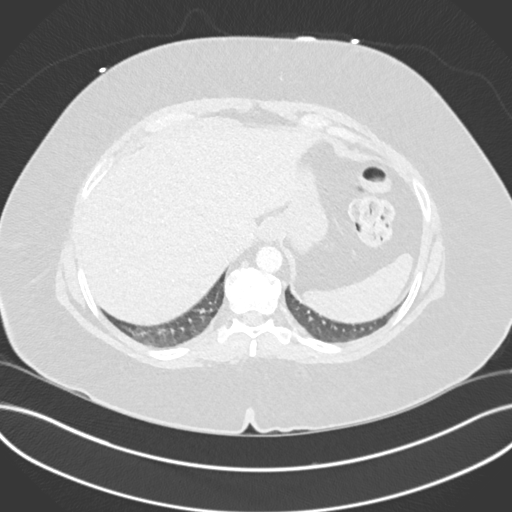
[im 37/111  mediastinal]
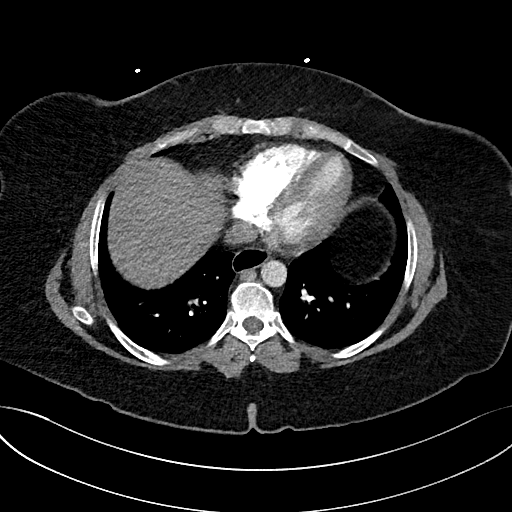
[im 56/111  lung]
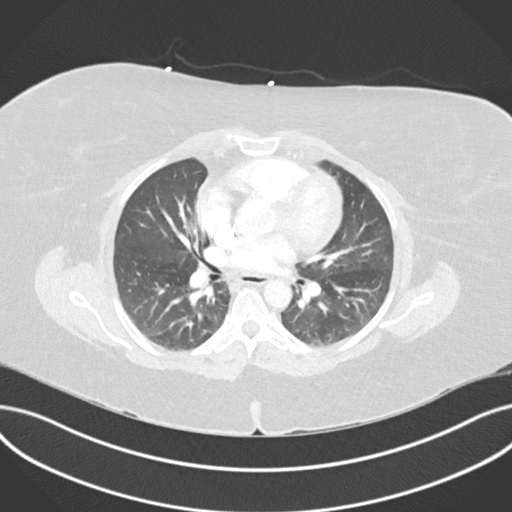
[im 74/111  mediastinal]
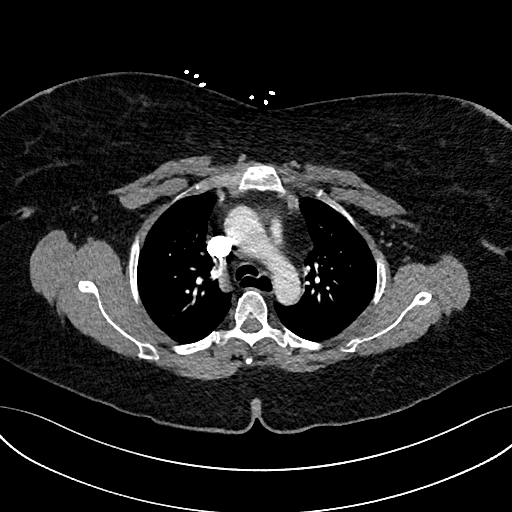
[im 92/111  lung]
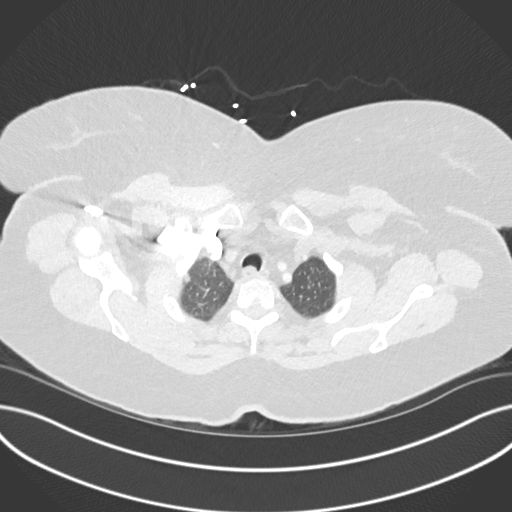

[Series 8: pe thins · axial · 0.78mm/px · z∈[+1122,+1298]mm · 8 of 315 slices shown]
[im 32/315  lung]
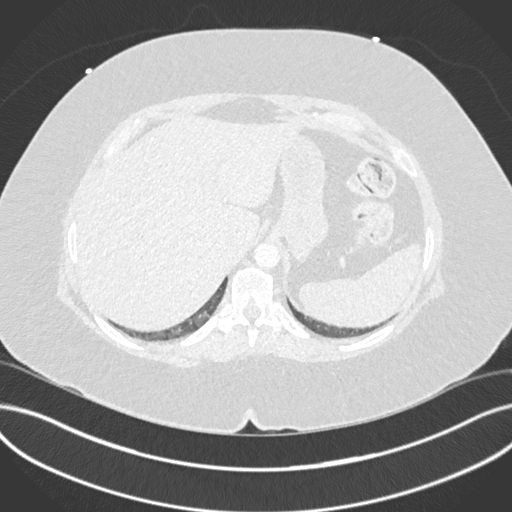
[im 63/315  lung]
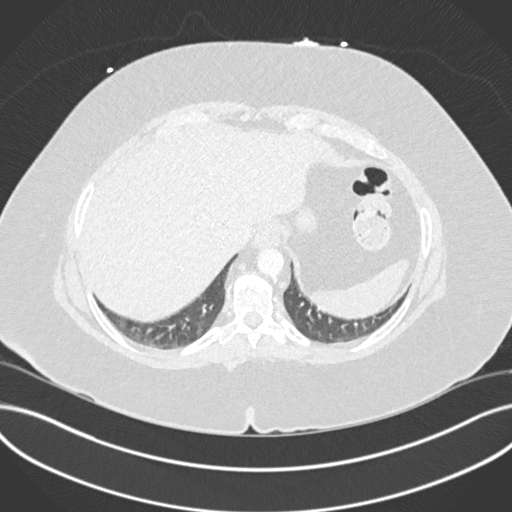
[im 95/315  lung]
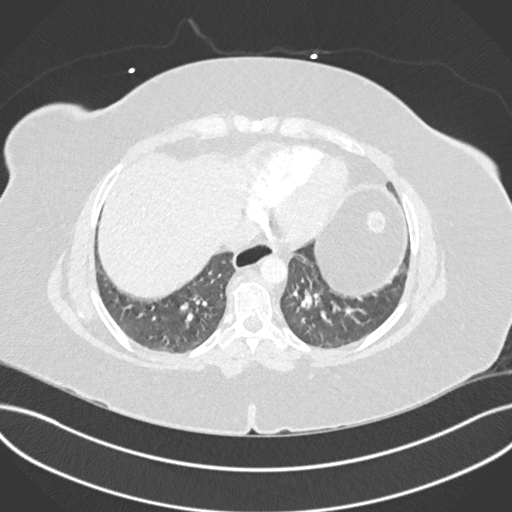
[im 142/315  lung]
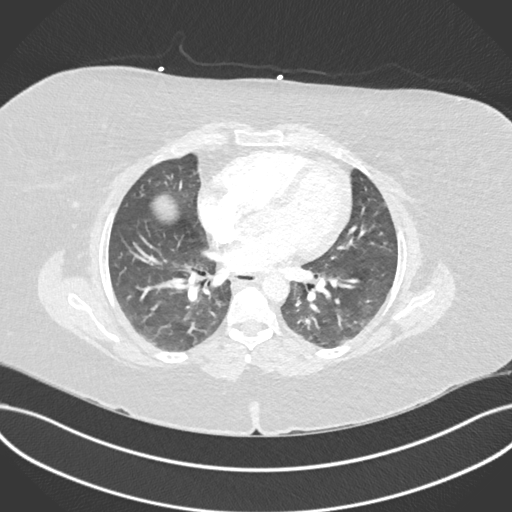
[im 173/315  lung]
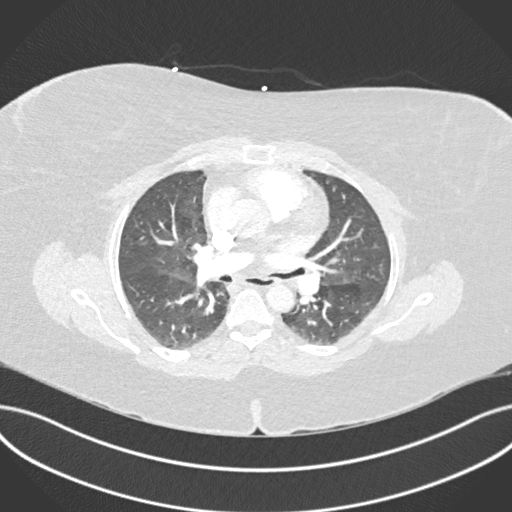
[im 220/315  lung]
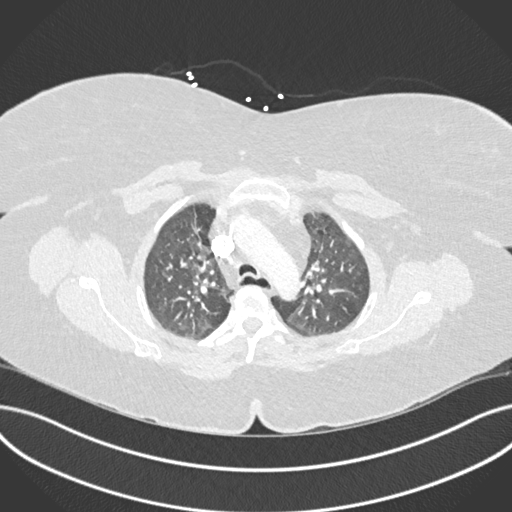
[im 252/315  lung]
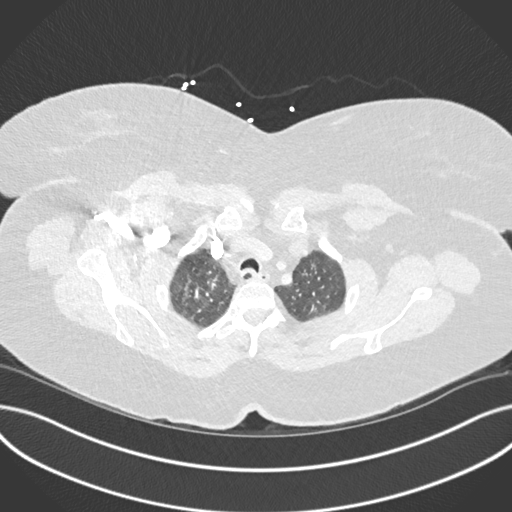
[im 283/315  lung]
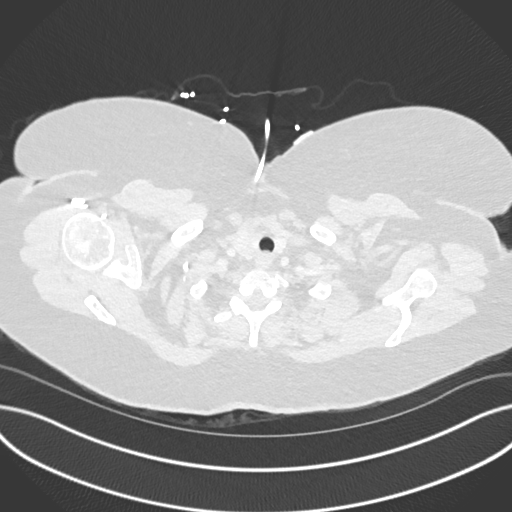

[Series 9: pe 2mm cor · coronal · 0.47mm/px · 1 of 142 slices shown]
[im 71/142  mediastinal]
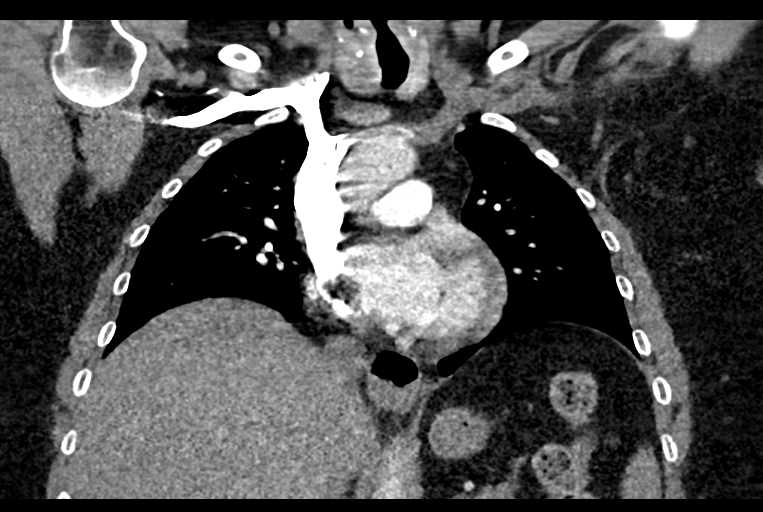

[14 of 36 positions shown; findings below may reference images not displayed]

FINDINGS: Cardiovascular: Thoracic aorta shows no evidence of aneurysmal
dilatation or dissection. The degree of opacification is somewhat
limited however. No significant atherosclerotic changes are noted.
No cardiac enlargement is seen. No coronary calcifications are
noted. The pulmonary artery shows a normal branching pattern. No
definitive filling defects are identified to suggest pulmonary
emboli.

Mediastinum/Nodes: Thoracic inlet is within normal limits. No
sizable hilar or mediastinal adenopathy is noted. The esophagus is
within normal limits.

Lungs/Pleura: Lungs are well aerated bilaterally. No focal confluent
infiltrate or sizable effusion is seen. No focal sizable parenchymal
nodule is seen.

Upper Abdomen: Visualized upper abdomen is unremarkable.

Musculoskeletal: Bony structures show no acute abnormality. Mild
degenerative changes of the thoracic spine are seen.

Review of the MIP images confirms the above findings.
IMPRESSION: No evidence of pulmonary emboli.

No acute abnormality seen.

## 2020-06-21 IMAGING — CR DG CHEST 2V
2 series · 2 of 2 positions shown · non-contrast
Comparison: Chest radiograph 12/25/2018

CLINICAL DATA: Left arm and chest pain.

EXAM:
CHEST - 2 VIEW

[chest pa]
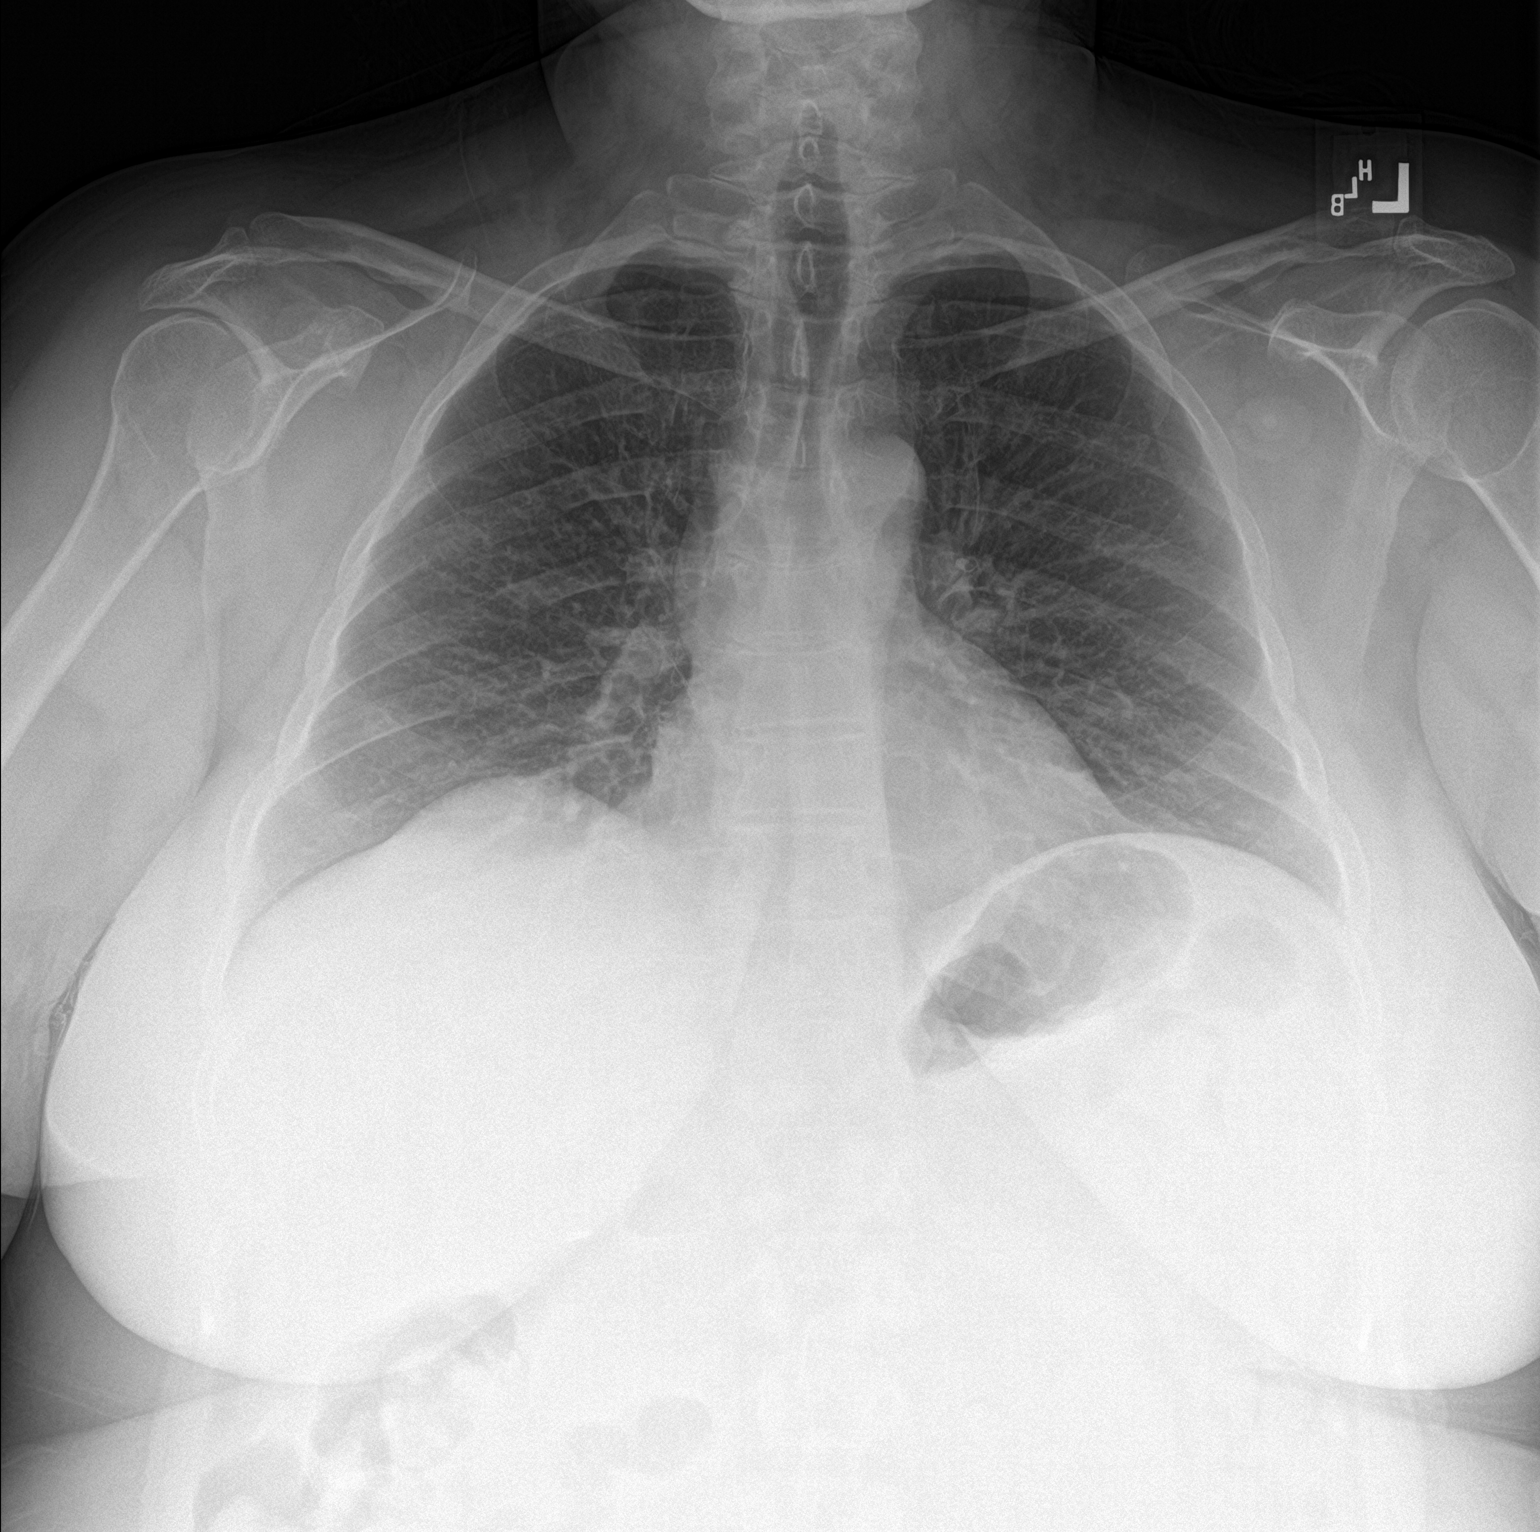

[chest lat]
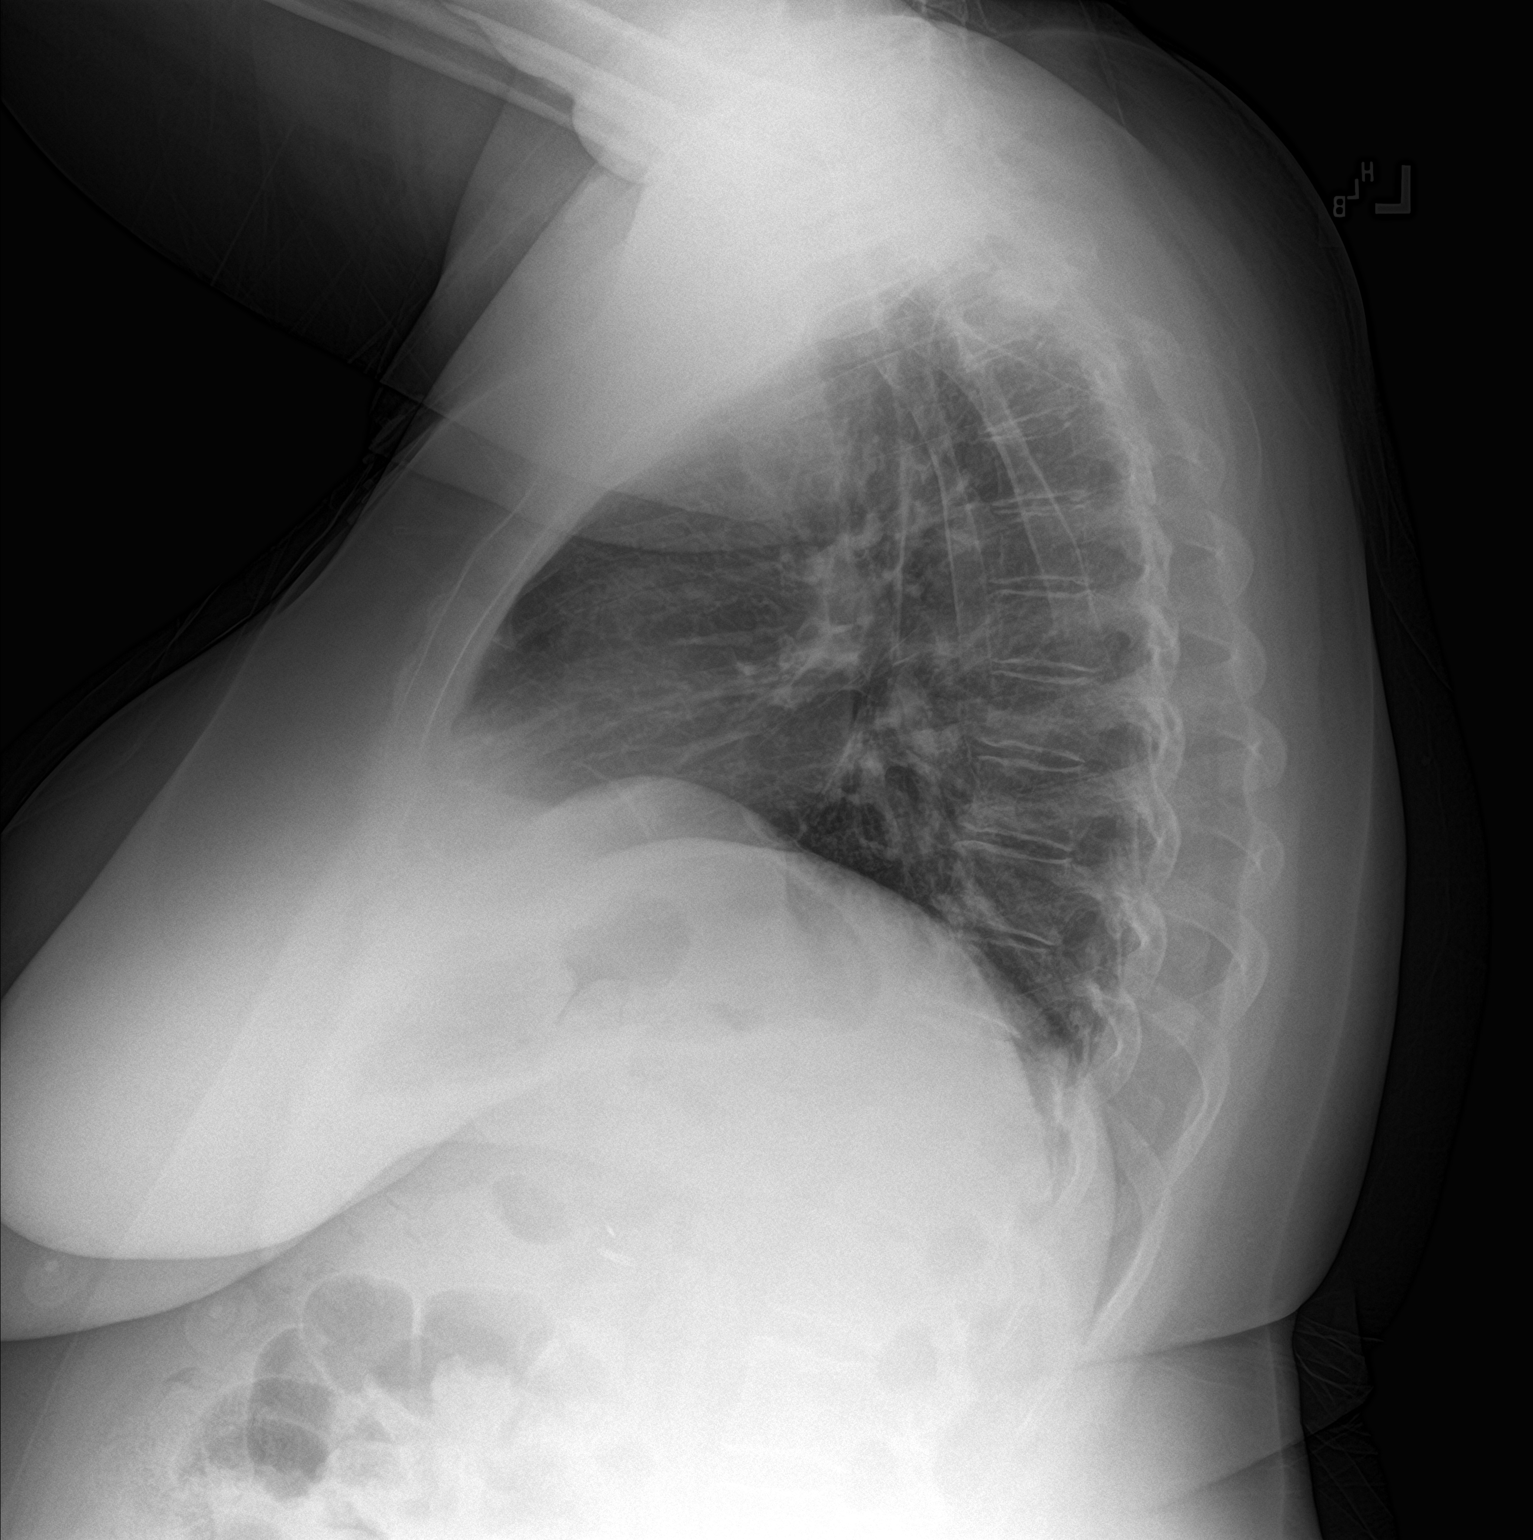

[2 of 2 positions shown; findings below may reference images not displayed]

FINDINGS: The heart size and mediastinal contours are within normal limits.
Both lungs are clear. The visualized skeletal structures are
unremarkable.
IMPRESSION: No active cardiopulmonary disease.

## 2020-07-03 DIAGNOSIS — F332 Major depressive disorder, recurrent severe without psychotic features: Secondary | ICD-10-CM | POA: Diagnosis not present

## 2020-07-03 DIAGNOSIS — F3130 Bipolar disorder, current episode depressed, mild or moderate severity, unspecified: Secondary | ICD-10-CM | POA: Diagnosis not present

## 2020-07-05 DIAGNOSIS — F251 Schizoaffective disorder, depressive type: Secondary | ICD-10-CM | POA: Diagnosis not present

## 2020-07-11 DIAGNOSIS — Z7189 Other specified counseling: Secondary | ICD-10-CM | POA: Diagnosis not present

## 2020-07-14 DIAGNOSIS — Z713 Dietary counseling and surveillance: Secondary | ICD-10-CM | POA: Diagnosis not present

## 2020-08-02 DIAGNOSIS — F251 Schizoaffective disorder, depressive type: Secondary | ICD-10-CM | POA: Diagnosis not present

## 2020-08-11 DIAGNOSIS — K209 Esophagitis, unspecified without bleeding: Secondary | ICD-10-CM | POA: Diagnosis not present

## 2020-08-11 DIAGNOSIS — K3189 Other diseases of stomach and duodenum: Secondary | ICD-10-CM | POA: Diagnosis not present

## 2020-08-11 DIAGNOSIS — K289 Gastrojejunal ulcer, unspecified as acute or chronic, without hemorrhage or perforation: Secondary | ICD-10-CM | POA: Diagnosis not present

## 2020-08-16 DIAGNOSIS — F251 Schizoaffective disorder, depressive type: Secondary | ICD-10-CM | POA: Diagnosis not present

## 2020-08-26 DIAGNOSIS — Z6841 Body Mass Index (BMI) 40.0 and over, adult: Secondary | ICD-10-CM | POA: Diagnosis not present

## 2020-08-26 DIAGNOSIS — F54 Psychological and behavioral factors associated with disorders or diseases classified elsewhere: Secondary | ICD-10-CM | POA: Diagnosis not present

## 2020-08-26 DIAGNOSIS — Z7189 Other specified counseling: Secondary | ICD-10-CM | POA: Diagnosis not present

## 2020-08-28 DIAGNOSIS — E559 Vitamin D deficiency, unspecified: Secondary | ICD-10-CM | POA: Diagnosis not present

## 2020-08-28 DIAGNOSIS — F332 Major depressive disorder, recurrent severe without psychotic features: Secondary | ICD-10-CM | POA: Diagnosis not present

## 2020-08-28 DIAGNOSIS — F3130 Bipolar disorder, current episode depressed, mild or moderate severity, unspecified: Secondary | ICD-10-CM | POA: Diagnosis not present

## 2020-09-09 DIAGNOSIS — Z23 Encounter for immunization: Secondary | ICD-10-CM | POA: Diagnosis not present

## 2020-09-09 DIAGNOSIS — R0683 Snoring: Secondary | ICD-10-CM | POA: Diagnosis not present

## 2020-09-09 DIAGNOSIS — G4710 Hypersomnia, unspecified: Secondary | ICD-10-CM | POA: Diagnosis not present

## 2020-09-27 DIAGNOSIS — F251 Schizoaffective disorder, depressive type: Secondary | ICD-10-CM | POA: Diagnosis not present

## 2020-10-06 DIAGNOSIS — G4733 Obstructive sleep apnea (adult) (pediatric): Secondary | ICD-10-CM | POA: Diagnosis not present

## 2020-10-29 DIAGNOSIS — G4710 Hypersomnia, unspecified: Secondary | ICD-10-CM | POA: Diagnosis not present

## 2020-10-29 DIAGNOSIS — G4733 Obstructive sleep apnea (adult) (pediatric): Secondary | ICD-10-CM | POA: Diagnosis not present

## 2020-10-29 DIAGNOSIS — R0683 Snoring: Secondary | ICD-10-CM | POA: Diagnosis not present

## 2020-11-22 DIAGNOSIS — F251 Schizoaffective disorder, depressive type: Secondary | ICD-10-CM | POA: Diagnosis not present

## 2020-12-01 DIAGNOSIS — G4733 Obstructive sleep apnea (adult) (pediatric): Secondary | ICD-10-CM | POA: Diagnosis not present

## 2020-12-04 DIAGNOSIS — Z111 Encounter for screening for respiratory tuberculosis: Secondary | ICD-10-CM | POA: Diagnosis not present

## 2020-12-06 DIAGNOSIS — Z111 Encounter for screening for respiratory tuberculosis: Secondary | ICD-10-CM | POA: Diagnosis not present

## 2020-12-12 DIAGNOSIS — H6011 Cellulitis of right external ear: Secondary | ICD-10-CM | POA: Diagnosis not present

## 2020-12-12 DIAGNOSIS — S00451A Superficial foreign body of right ear, initial encounter: Secondary | ICD-10-CM | POA: Diagnosis not present

## 2020-12-20 DIAGNOSIS — F251 Schizoaffective disorder, depressive type: Secondary | ICD-10-CM | POA: Diagnosis not present

## 2020-12-29 DIAGNOSIS — G4733 Obstructive sleep apnea (adult) (pediatric): Secondary | ICD-10-CM | POA: Diagnosis not present

## 2021-01-09 DIAGNOSIS — F332 Major depressive disorder, recurrent severe without psychotic features: Secondary | ICD-10-CM | POA: Diagnosis not present

## 2021-01-09 DIAGNOSIS — F3130 Bipolar disorder, current episode depressed, mild or moderate severity, unspecified: Secondary | ICD-10-CM | POA: Diagnosis not present

## 2021-02-06 DIAGNOSIS — R0683 Snoring: Secondary | ICD-10-CM | POA: Diagnosis not present

## 2021-02-06 DIAGNOSIS — G4733 Obstructive sleep apnea (adult) (pediatric): Secondary | ICD-10-CM | POA: Diagnosis not present

## 2021-02-06 DIAGNOSIS — G4710 Hypersomnia, unspecified: Secondary | ICD-10-CM | POA: Diagnosis not present

## 2021-06-02 ENCOUNTER — Inpatient Hospital Stay (HOSPITAL_BASED_OUTPATIENT_CLINIC_OR_DEPARTMENT_OTHER)
Admission: EM | Admit: 2021-06-02 | Discharge: 2021-06-04 | DRG: 355 | Disposition: A | Discharge status: Home / Self Care | Payer: BC Managed Care – PPO | Attending: General Surgery | Admitting: General Surgery

## 2021-06-02 ENCOUNTER — Encounter (HOSPITAL_BASED_OUTPATIENT_CLINIC_OR_DEPARTMENT_OTHER): Payer: Self-pay

## 2021-06-02 ENCOUNTER — Emergency Department (HOSPITAL_BASED_OUTPATIENT_CLINIC_OR_DEPARTMENT_OTHER): Payer: BC Managed Care – PPO

## 2021-06-02 DIAGNOSIS — Z20822 Contact with and (suspected) exposure to covid-19: Secondary | ICD-10-CM | POA: Diagnosis present

## 2021-06-02 DIAGNOSIS — Z888 Allergy status to other drugs, medicaments and biological substances status: Secondary | ICD-10-CM

## 2021-06-02 DIAGNOSIS — Z79899 Other long term (current) drug therapy: Secondary | ICD-10-CM

## 2021-06-02 DIAGNOSIS — K460 Unspecified abdominal hernia with obstruction, without gangrene: Secondary | ICD-10-CM

## 2021-06-02 DIAGNOSIS — K436 Other and unspecified ventral hernia with obstruction, without gangrene: Principal | ICD-10-CM | POA: Diagnosis present

## 2021-06-02 DIAGNOSIS — K56609 Unspecified intestinal obstruction, unspecified as to partial versus complete obstruction: Secondary | ICD-10-CM

## 2021-06-02 DIAGNOSIS — Z87892 Personal history of anaphylaxis: Secondary | ICD-10-CM

## 2021-06-02 DIAGNOSIS — Z87891 Personal history of nicotine dependence: Secondary | ICD-10-CM

## 2021-06-02 DIAGNOSIS — Z90710 Acquired absence of both cervix and uterus: Secondary | ICD-10-CM

## 2021-06-02 LAB — CBC WITH DIFFERENTIAL/PLATELET
Abs Immature Granulocytes: 0.03 10*3/uL (ref 0.00–0.07)
Basophils Absolute: 0.1 10*3/uL (ref 0.0–0.1)
Basophils Relative: 1 %
Eosinophils Absolute: 0.2 10*3/uL (ref 0.0–0.5)
Eosinophils Relative: 2 %
HCT: 46.2 % — ABNORMAL HIGH (ref 36.0–46.0)
Hemoglobin: 15.1 g/dL — ABNORMAL HIGH (ref 12.0–15.0)
Immature Granulocytes: 0 %
Lymphocytes Relative: 22 %
Lymphs Abs: 2.6 10*3/uL (ref 0.7–4.0)
MCH: 28.7 pg (ref 26.0–34.0)
MCHC: 32.7 g/dL (ref 30.0–36.0)
MCV: 87.7 fL (ref 80.0–100.0)
Monocytes Absolute: 0.7 10*3/uL (ref 0.1–1.0)
Monocytes Relative: 6 %
Neutro Abs: 8.1 10*3/uL — ABNORMAL HIGH (ref 1.7–7.7)
Neutrophils Relative %: 69 %
Platelets: 350 10*3/uL (ref 150–400)
RBC: 5.27 MIL/uL — ABNORMAL HIGH (ref 3.87–5.11)
RDW: 14.6 % (ref 11.5–15.5)
WBC: 11.7 10*3/uL — ABNORMAL HIGH (ref 4.0–10.5)
nRBC: 0 % (ref 0.0–0.2)

## 2021-06-02 LAB — COMPREHENSIVE METABOLIC PANEL
ALT: 33 U/L (ref 0–44)
AST: 21 U/L (ref 15–41)
Albumin: 4.2 g/dL (ref 3.5–5.0)
Alkaline Phosphatase: 95 U/L (ref 38–126)
Anion gap: 10 (ref 5–15)
BUN: 32 mg/dL — ABNORMAL HIGH (ref 6–20)
CO2: 29 mmol/L (ref 22–32)
Calcium: 10 mg/dL (ref 8.9–10.3)
Chloride: 101 mmol/L (ref 98–111)
Creatinine, Ser: 0.85 mg/dL (ref 0.44–1.00)
GFR, Estimated: >60 mL/min (ref >60)
Glucose, Bld: 103 mg/dL — ABNORMAL HIGH (ref 70–99)
Potassium: 3.9 mmol/L (ref 3.5–5.1)
Sodium: 140 mmol/L (ref 135–145)
Total Bilirubin: 0.3 mg/dL (ref 0.3–1.2)
Total Protein: 8.2 g/dL — ABNORMAL HIGH (ref 6.5–8.1)

## 2021-06-02 LAB — LIPASE, BLOOD: Lipase: <10 U/L — ABNORMAL LOW (ref 11–51)

## 2021-06-02 MED ORDER — ONDANSETRON HCL 4 MG/2ML IJ SOLN
4.0000 mg | Freq: Once | INTRAMUSCULAR | Status: AC
  Administered 2021-06-02: 4 mg via INTRAVENOUS
  Filled 2021-06-02: qty 2

## 2021-06-02 MED ORDER — SODIUM CHLORIDE 0.9 % IV BOLUS
1000.0000 mL | Freq: Once | INTRAVENOUS | Status: AC
  Administered 2021-06-02: 1000 mL via INTRAVENOUS

## 2021-06-02 MED ORDER — MORPHINE SULFATE (PF) 4 MG/ML IV SOLN
4.0000 mg | Freq: Once | INTRAVENOUS | Status: AC
  Administered 2021-06-02: 4 mg via INTRAVENOUS
  Filled 2021-06-02: qty 1

## 2021-06-02 MED ORDER — IOHEXOL 350 MG/ML SOLN
75.0000 mL | Freq: Once | INTRAVENOUS | Status: AC | PRN
  Administered 2021-06-02: 75 mL via INTRAVENOUS

## 2021-06-02 NOTE — ED Notes (Signed)
Patient transported to CT for Examination at this time.

## 2021-06-02 NOTE — ED Provider Notes (Signed)
[facility] EMERGENCY DEPT Provider Note   CSN: [id number] Arrival date & time: 06/02/21  2026     History No chief complaint on file.   Annette Spears is a 50 y.o. female.  Pt presents to the ED today with abdominal pain.  Pt said she had pain starting this morning.  Pt denies any f/c.  She did have a bowel movement today.      Past Medical History:  Diagnosis Date   Anxiety    Depression    Irregular heart beat    TBI (traumatic brain injury) [facility])     Patient Active Problem List   Diagnosis Date Noted   Severe recurrent major depression without psychotic features (HCC) 03/14/2019   Severe episode of recurrent major depressive disorder, with psychotic features (HCC)     Past Surgical History:  Procedure Laterality Date   ABDOMINAL HYSTERECTOMY     CHOLECYSTECTOMY       OB History   No obstetric history on file.     Family History  Adopted: Yes    Social History   Tobacco Use   Smoking status: Former    Types: Cigarettes   Smokeless tobacco: Never  Vaping Use   Vaping Use: Never used  Substance Use Topics   Alcohol use: Not Currently   Drug use: Not Currently    Home Medications Prior to Admission medications   Medication Sig Start Date End Date Taking? Authorizing Provider  lurasidone (LATUDA) 80 MG TABS tablet Take 1 tablet (80 mg total) by mouth at bedtime. 03/17/19   Oneta Rack, NP  mirtazapine (REMERON) 7.5 MG tablet Take 1 tablet (7.5 mg total) by mouth at bedtime. 03/17/19   Oneta Rack, NP  WELLBUTRIN XL 150 MG 24 hr tablet Take 450 mg by mouth daily. 12/02/18   [provider]    Allergies    Geodon [ziprasidone hcl], Lithium, and Risperdal [risperidone]  Review of Systems   Review of Systems  Gastrointestinal:  Positive for abdominal pain and nausea.  All other systems reviewed and are negative.  Physical Exam Updated Vital Signs BP 125/88 (BP Location: Right Arm)   Pulse 96   Temp 97.8 F (36.6 C)  (Oral)   Resp 19   Ht 4\' 11"  (1.499 m)   Wt 98.4 kg   LMP 12/25/2018   SpO2 94%   BMI 43.83 kg/m   Physical Exam Vitals and nursing note reviewed.  Constitutional:      Appearance: Normal appearance. She is obese.  HENT:     Head: Normocephalic and atraumatic.     Right Ear: External ear normal.     Left Ear: External ear normal.     Nose: Nose normal.     Mouth/Throat:     Mouth: Mucous membranes are moist.     Pharynx: Oropharynx is clear.  Eyes:     Extraocular Movements: Extraocular movements intact.     Conjunctiva/sclera: Conjunctivae normal.     Pupils: Pupils are equal, round, and reactive to light.  Cardiovascular:     Rate and Rhythm: Normal rate and regular rhythm.     Pulses: Normal pulses.     Heart sounds: Normal heart sounds.  Pulmonary:     Effort: Pulmonary effort is normal.     Breath sounds: Normal breath sounds.  Abdominal:     General: Abdomen is flat. Bowel sounds are normal.     Palpations: Abdomen is soft.     Hernia: A hernia  is present. Hernia is present in the ventral area.     Comments: Hernia is tender.  Not reducible.  Musculoskeletal:        General: Normal range of motion.     Cervical back: Normal range of motion and neck supple.  Skin:    General: Skin is warm.     Capillary Refill: Capillary refill takes less than 2 seconds.  Neurological:     General: No focal deficit present.     Mental Status: She is alert and oriented to person, place, and time.  Psychiatric:        Mood and Affect: Mood normal.        Behavior: Behavior normal.        Thought Content: Thought content normal.        Judgment: Judgment normal.    ED Results / Procedures / Treatments   Labs (all labs ordered are listed, but only abnormal results are displayed) Labs Reviewed  COMPREHENSIVE METABOLIC PANEL - Abnormal; Notable for the following components:      Result Value   Glucose, Bld 103 (*)    BUN 32 (*)    Total Protein 8.2 (*)    All other  components within normal limits  LIPASE, BLOOD - Abnormal; Notable for the following components:   Lipase <10 (*)    All other components within normal limits  CBC WITH DIFFERENTIAL/PLATELET - Abnormal; Notable for the following components:   WBC 11.7 (*)    RBC 5.27 (*)    Hemoglobin 15.1 (*)    HCT 46.2 (*)    Neutro Abs 8.1 (*)    All other components within normal limits  RESP PANEL BY RT-PCR (FLU A&B, COVID) ARPGX2  URINALYSIS, ROUTINE W REFLEX MICROSCOPIC  PREGNANCY, URINE    EKG None  Radiology CT ABDOMEN PELVIS W CONTRAST  Result Date: 06/02/2021 CLINICAL DATA:  Nonlocalized acute abdominal pain. Right lower quadrant pain nausea the started this morning. EXAM: CT ABDOMEN AND PELVIS WITH CONTRAST TECHNIQUE: Multidetector CT imaging of the abdomen and pelvis was performed using the standard protocol following bolus administration of intravenous contrast. CONTRAST:  39mL OMNIPAQUE IOHEXOL 350 MG/ML SOLN COMPARISON:  CT angiography chest 01/13/2020 FINDINGS: Lower chest: No acute abnormality. Hepatobiliary: No focal liver abnormality. Status post cholecystectomy. No biliary dilatation. Pancreas: No focal lesion. Normal pancreatic contour. No surrounding inflammatory changes. No main pancreatic ductal dilatation. Spleen: Normal in size without focal abnormality. Adrenals/Urinary Tract: No adrenal nodule bilaterally. Bilateral kidneys enhance symmetrically. No hydronephrosis. No hydroureter. The urinary bladder is unremarkable. Stomach/Bowel: Stomach is within normal limits. No evidence of small bowel wall thickening or dilatation. Large ventral wall hernia containing a large segment of the transverse colon with associated transition point at the exit site and partial distal decompression of the distal colon. Ascending colon and proximal transverse colon filled with stool and gas. Associated trace fat stranding within the umbilical hernia concerning for developing ischemia of the mesentery.  No pneumatosis. No large bowel wall thickening. Appendix appears normal. Vascular/Lymphatic: No abdominal aorta or iliac aneurysm. No abdominal, pelvic, or inguinal lymphadenopathy. Reproductive: Status post hysterectomy. No adnexal masses. Other: No intraperitoneal free fluid. No intraperitoneal free gas. No organized fluid collection. Musculoskeletal: No suspicious lytic or blastic osseous lesions. No acute displaced fracture. Multilevel degenerative changes of the spine. IMPRESSION: Question early/partial large bowel obstruction with transition point at the exit site of a large paraumbilical hernia containing a segment of transverse colon. Associated trace fat stranding within the  umbilical hernia-correlate clinically for incarceration. No associated bowel perforation. Electronically Signed   By: Tish Frederickson M.D.   On: 06/02/2021 22:32    Procedures Procedures   Medications Ordered in ED Medications  sodium chloride 0.9 % bolus 1,000 mL (0 mLs Intravenous Stopped 06/02/21 2314)  ondansetron (ZOFRAN) injection 4 mg (4 mg Intravenous Given 06/02/21 2159)  morphine 4 MG/ML injection 4 mg (4 mg Intravenous Given 06/02/21 2200)  iohexol (OMNIPAQUE) 350 MG/ML injection 75 mL (75 mLs Intravenous Contrast Given 06/02/21 2209)    ED Course  I have reviewed the triage vital signs and the nursing notes.  Pertinent labs & imaging results that were available during my care of the patient were reviewed by me and considered in my medical decision making (see chart for details).    MDM Rules/Calculators/A&P                           Results of Ct scan d/w Dr. Bedelia Person.  She recommended sending pt to [facility] ED.  ED provider to call her upon arrival.  Pt d/w Dr. Rush Landmark who accepted pt for transfer.  Final Clinical Impression(s) / ED Diagnoses Final diagnoses:  Incarcerated hernia  Large bowel obstruction [facility])    Rx / DC Orders ED Discharge Orders     None        Jacalyn Lefevre, MD 06/02/21  2330

## 2021-06-02 NOTE — ED Triage Notes (Signed)
Pt reports RLQ pain and nausea that started this morning - denies fever.

## 2021-06-03 ENCOUNTER — Encounter (HOSPITAL_COMMUNITY): Payer: Self-pay

## 2021-06-03 ENCOUNTER — Inpatient Hospital Stay (HOSPITAL_COMMUNITY): Payer: BC Managed Care – PPO | Admitting: Certified Registered"

## 2021-06-03 ENCOUNTER — Encounter (HOSPITAL_COMMUNITY): Admission: EM | Disposition: A | Discharge status: Home / Self Care | Payer: Self-pay | Source: Home / Self Care

## 2021-06-03 DIAGNOSIS — K436 Other and unspecified ventral hernia with obstruction, without gangrene: Secondary | ICD-10-CM | POA: Diagnosis present

## 2021-06-03 DIAGNOSIS — Z79899 Other long term (current) drug therapy: Secondary | ICD-10-CM | POA: Diagnosis not present

## 2021-06-03 DIAGNOSIS — Z90710 Acquired absence of both cervix and uterus: Secondary | ICD-10-CM | POA: Diagnosis not present

## 2021-06-03 DIAGNOSIS — Z20822 Contact with and (suspected) exposure to covid-19: Secondary | ICD-10-CM | POA: Diagnosis present

## 2021-06-03 DIAGNOSIS — Z888 Allergy status to other drugs, medicaments and biological substances status: Secondary | ICD-10-CM | POA: Diagnosis not present

## 2021-06-03 DIAGNOSIS — Z87892 Personal history of anaphylaxis: Secondary | ICD-10-CM | POA: Diagnosis not present

## 2021-06-03 DIAGNOSIS — Z87891 Personal history of nicotine dependence: Secondary | ICD-10-CM | POA: Diagnosis not present

## 2021-06-03 HISTORY — PX: VENTRAL HERNIA REPAIR: SHX424

## 2021-06-03 LAB — SURGICAL PCR SCREEN
MRSA, PCR: NEGATIVE
Staphylococcus aureus: NEGATIVE

## 2021-06-03 LAB — CBC
HCT: 47.5 % — ABNORMAL HIGH (ref 36.0–46.0)
Hemoglobin: 15.4 g/dL — ABNORMAL HIGH (ref 12.0–15.0)
MCH: 29.1 pg (ref 26.0–34.0)
MCHC: 32.4 g/dL (ref 30.0–36.0)
MCV: 89.8 fL (ref 80.0–100.0)
Platelets: 321 10*3/uL (ref 150–400)
RBC: 5.29 MIL/uL — ABNORMAL HIGH (ref 3.87–5.11)
RDW: 14.6 % (ref 11.5–15.5)
WBC: 11.4 10*3/uL — ABNORMAL HIGH (ref 4.0–10.5)
nRBC: 0 % (ref 0.0–0.2)

## 2021-06-03 LAB — COMPREHENSIVE METABOLIC PANEL
ALT: 163 U/L — ABNORMAL HIGH (ref 0–44)
AST: 280 U/L — ABNORMAL HIGH (ref 15–41)
Albumin: 3.8 g/dL (ref 3.5–5.0)
Alkaline Phosphatase: 153 U/L — ABNORMAL HIGH (ref 38–126)
Anion gap: 10 (ref 5–15)
BUN: 25 mg/dL — ABNORMAL HIGH (ref 6–20)
CO2: 24 mmol/L (ref 22–32)
Calcium: 9.3 mg/dL (ref 8.9–10.3)
Chloride: 106 mmol/L (ref 98–111)
Creatinine, Ser: 0.89 mg/dL (ref 0.44–1.00)
GFR, Estimated: >60 mL/min (ref >60)
Glucose, Bld: 120 mg/dL — ABNORMAL HIGH (ref 70–99)
Potassium: 3.8 mmol/L (ref 3.5–5.1)
Sodium: 140 mmol/L (ref 135–145)
Total Bilirubin: 0.8 mg/dL (ref 0.3–1.2)
Total Protein: 7.5 g/dL (ref 6.5–8.1)

## 2021-06-03 LAB — TYPE AND SCREEN
ABO/RH(D): A POS
Antibody Screen: NEGATIVE

## 2021-06-03 LAB — RESP PANEL BY RT-PCR (FLU A&B, COVID) ARPGX2
Influenza A by PCR: NEGATIVE
Influenza B by PCR: NEGATIVE
SARS Coronavirus 2 by RT PCR: NEGATIVE

## 2021-06-03 LAB — LACTIC ACID, PLASMA: Lactic Acid, Venous: 1.1 mmol/L (ref 0.5–1.9)

## 2021-06-03 LAB — ABO/RH: ABO/RH(D): A POS

## 2021-06-03 SURGERY — REPAIR, HERNIA, VENTRAL
Anesthesia: General

## 2021-06-03 MED ORDER — FENTANYL CITRATE (PF) 100 MCG/2ML IJ SOLN
INTRAMUSCULAR | Status: DC | PRN
  Administered 2021-06-03 (×3): 50 ug via INTRAVENOUS

## 2021-06-03 MED ORDER — FENTANYL CITRATE (PF) 100 MCG/2ML IJ SOLN
25.0000 ug | INTRAMUSCULAR | Status: DC | PRN
  Administered 2021-06-03 (×2): 50 ug via INTRAVENOUS
  Administered 2021-06-03 (×2): 25 ug via INTRAVENOUS

## 2021-06-03 MED ORDER — ENOXAPARIN SODIUM 40 MG/0.4ML IJ SOSY
40.0000 mg | PREFILLED_SYRINGE | INTRAMUSCULAR | Status: DC
  Administered 2021-06-04: 40 mg via SUBCUTANEOUS
  Filled 2021-06-03: qty 0.4

## 2021-06-03 MED ORDER — CEFAZOLIN SODIUM 1 G IJ SOLR
INTRAMUSCULAR | Status: AC
  Filled 2021-06-03: qty 20

## 2021-06-03 MED ORDER — OXYCODONE HCL 5 MG PO TABS
5.0000 mg | ORAL_TABLET | ORAL | Status: DC | PRN
  Administered 2021-06-03: 5 mg via ORAL
  Administered 2021-06-04 (×2): 10 mg via ORAL
  Filled 2021-06-03: qty 2
  Filled 2021-06-03: qty 1
  Filled 2021-06-03: qty 2

## 2021-06-03 MED ORDER — ROCURONIUM BROMIDE 10 MG/ML (PF) SYRINGE
PREFILLED_SYRINGE | INTRAVENOUS | Status: AC
  Filled 2021-06-03: qty 10

## 2021-06-03 MED ORDER — PROPOFOL 10 MG/ML IV BOLUS
INTRAVENOUS | Status: DC | PRN
  Administered 2021-06-03: 170 mg via INTRAVENOUS

## 2021-06-03 MED ORDER — ACETAMINOPHEN 500 MG PO TABS
1000.0000 mg | ORAL_TABLET | Freq: Four times a day (QID) | ORAL | Status: DC
  Administered 2021-06-03 – 2021-06-04 (×5): 1000 mg via ORAL
  Filled 2021-06-03 (×5): qty 2

## 2021-06-03 MED ORDER — DOCUSATE SODIUM 100 MG PO CAPS
100.0000 mg | ORAL_CAPSULE | Freq: Two times a day (BID) | ORAL | Status: DC
  Administered 2021-06-03 – 2021-06-04 (×3): 100 mg via ORAL
  Filled 2021-06-03 (×3): qty 1

## 2021-06-03 MED ORDER — PHENYLEPHRINE 40 MCG/ML (10ML) SYRINGE FOR IV PUSH (FOR BLOOD PRESSURE SUPPORT)
PREFILLED_SYRINGE | INTRAVENOUS | Status: DC | PRN
  Administered 2021-06-03: 160 ug via INTRAVENOUS
  Administered 2021-06-03: 80 ug via INTRAVENOUS

## 2021-06-03 MED ORDER — MORPHINE SULFATE (PF) 2 MG/ML IV SOLN
2.0000 mg | INTRAVENOUS | Status: DC | PRN
  Administered 2021-06-03: 2 mg via INTRAVENOUS
  Filled 2021-06-03: qty 1

## 2021-06-03 MED ORDER — BUPIVACAINE LIPOSOME 1.3 % IJ SUSP
INTRAMUSCULAR | Status: DC | PRN
Start: 2021-06-03 — End: 2021-06-03
  Administered 2021-06-03: 40 mL

## 2021-06-03 MED ORDER — PROMETHAZINE HCL 25 MG/ML IJ SOLN: 6.2500 mg | INTRAMUSCULAR | Status: DC | PRN

## 2021-06-03 MED ORDER — ROCURONIUM BROMIDE 10 MG/ML (PF) SYRINGE
PREFILLED_SYRINGE | INTRAVENOUS | Status: DC | PRN
  Administered 2021-06-03: 60 mg via INTRAVENOUS
  Administered 2021-06-03 (×2): 20 mg via INTRAVENOUS

## 2021-06-03 MED ORDER — DEXAMETHASONE SODIUM PHOSPHATE 10 MG/ML IJ SOLN
INTRAMUSCULAR | Status: AC
  Filled 2021-06-03: qty 1

## 2021-06-03 MED ORDER — ONDANSETRON HCL 4 MG/2ML IJ SOLN
INTRAMUSCULAR | Status: AC
  Filled 2021-06-03: qty 2

## 2021-06-03 MED ORDER — MIDAZOLAM HCL 2 MG/2ML IJ SOLN
INTRAMUSCULAR | Status: AC
  Filled 2021-06-03: qty 2

## 2021-06-03 MED ORDER — FENTANYL CITRATE (PF) 100 MCG/2ML IJ SOLN
INTRAMUSCULAR | Status: AC
  Filled 2021-06-03: qty 2

## 2021-06-03 MED ORDER — LACTATED RINGERS IV SOLN: INTRAVENOUS | Status: DC

## 2021-06-03 MED ORDER — CEFAZOLIN SODIUM-DEXTROSE 2-3 GM-%(50ML) IV SOLR
INTRAVENOUS | Status: DC | PRN
  Administered 2021-06-03: 2 g via INTRAVENOUS

## 2021-06-03 MED ORDER — ONDANSETRON HCL 4 MG/2ML IJ SOLN
INTRAMUSCULAR | Status: DC | PRN
  Administered 2021-06-03: 4 mg via INTRAVENOUS

## 2021-06-03 MED ORDER — ONDANSETRON 4 MG PO TBDP: 4.0000 mg | ORAL_TABLET | Freq: Four times a day (QID) | ORAL | Status: DC | PRN

## 2021-06-03 MED ORDER — METHOCARBAMOL 500 MG PO TABS
1000.0000 mg | ORAL_TABLET | Freq: Three times a day (TID) | ORAL | Status: DC
  Administered 2021-06-03 – 2021-06-04 (×3): 1000 mg via ORAL
  Filled 2021-06-03 (×3): qty 2

## 2021-06-03 MED ORDER — 0.9 % SODIUM CHLORIDE (POUR BTL) OPTIME
TOPICAL | Status: DC | PRN
  Administered 2021-06-03: 1000 mL

## 2021-06-03 MED ORDER — PHENYLEPHRINE 40 MCG/ML (10ML) SYRINGE FOR IV PUSH (FOR BLOOD PRESSURE SUPPORT)
PREFILLED_SYRINGE | INTRAVENOUS | Status: AC
  Filled 2021-06-03: qty 20

## 2021-06-03 MED ORDER — ONDANSETRON HCL 4 MG/2ML IJ SOLN: 4.0000 mg | Freq: Four times a day (QID) | INTRAMUSCULAR | Status: DC | PRN

## 2021-06-03 MED ORDER — MIDAZOLAM HCL 2 MG/2ML IJ SOLN
INTRAMUSCULAR | Status: DC | PRN
  Administered 2021-06-03: 2 mg via INTRAVENOUS

## 2021-06-03 MED ORDER — MORPHINE SULFATE (PF) 2 MG/ML IV SOLN
2.0000 mg | INTRAVENOUS | Status: DC | PRN
  Administered 2021-06-03: 2 mg via INTRAVENOUS
  Administered 2021-06-03 (×2): 4 mg via INTRAVENOUS
  Filled 2021-06-03 (×2): qty 2
  Filled 2021-06-03 (×2): qty 1

## 2021-06-03 MED ORDER — STERILE WATER FOR IRRIGATION IR SOLN
Status: DC | PRN
  Administered 2021-06-03: 1000 mL

## 2021-06-03 MED ORDER — SUGAMMADEX SODIUM 200 MG/2ML IV SOLN
INTRAVENOUS | Status: DC | PRN
Start: 2021-06-03 — End: 2021-06-03
  Administered 2021-06-03: 200 mg via INTRAVENOUS

## 2021-06-03 MED ORDER — POLYETHYLENE GLYCOL 3350 17 G PO PACK: 17.0000 g | PACK | Freq: Every day | ORAL | Status: DC | PRN

## 2021-06-03 MED ORDER — HYDROMORPHONE HCL 1 MG/ML IJ SOLN
1.0000 mg | Freq: Once | INTRAMUSCULAR | Status: AC
Start: 2021-06-03 — End: 2021-06-03
  Administered 2021-06-03: 1 mg via INTRAVENOUS
  Filled 2021-06-03: qty 1

## 2021-06-03 MED ORDER — CHLORHEXIDINE GLUCONATE 0.12 % MT SOLN
OROMUCOSAL | Status: AC
  Filled 2021-06-03: qty 15

## 2021-06-03 MED ORDER — FENTANYL CITRATE (PF) 250 MCG/5ML IJ SOLN
INTRAMUSCULAR | Status: AC
  Filled 2021-06-03: qty 5

## 2021-06-03 MED ORDER — PROPOFOL 10 MG/ML IV BOLUS
INTRAVENOUS | Status: AC
  Filled 2021-06-03: qty 20

## 2021-06-03 MED ORDER — DEXAMETHASONE SODIUM PHOSPHATE 10 MG/ML IJ SOLN
INTRAMUSCULAR | Status: DC | PRN
Start: 2021-06-03 — End: 2021-06-03
  Administered 2021-06-03: 8 mg via INTRAVENOUS

## 2021-06-03 MED ORDER — BUPIVACAINE LIPOSOME 1.3 % IJ SUSP
INTRAMUSCULAR | Status: AC
  Filled 2021-06-03: qty 20

## 2021-06-03 MED ORDER — LIDOCAINE 2% (20 MG/ML) 5 ML SYRINGE
INTRAMUSCULAR | Status: DC | PRN
  Administered 2021-06-03: 100 mg via INTRAVENOUS

## 2021-06-03 SURGICAL SUPPLY — 39 items
BAG COUNTER SPONGE SURGICOUNT (BAG) ×2 IMPLANT
BAG SURGICOUNT SPONGE COUNTING (BAG) ×1
BINDER ABDOMINAL 12 ML 46-62 (SOFTGOODS) ×3 IMPLANT
CANISTER SUCT 3000ML PPV (MISCELLANEOUS) ×3 IMPLANT
CHLORAPREP W/TINT 26 (MISCELLANEOUS) ×6 IMPLANT
COVER SURGICAL LIGHT HANDLE (MISCELLANEOUS) ×3 IMPLANT
DERMABOND ADVANCED (GAUZE/BANDAGES/DRESSINGS) ×2
DERMABOND ADVANCED .7 DNX12 (GAUZE/BANDAGES/DRESSINGS) ×1 IMPLANT
DRAPE INCISE IOBAN 66X45 STRL (DRAPES) ×3 IMPLANT
DRAPE LAPAROSCOPIC ABDOMINAL (DRAPES) ×3 IMPLANT
ELECT CAUTERY BLADE 6.4 (BLADE) ×3 IMPLANT
ELECT REM PT RETURN 9FT ADLT (ELECTROSURGICAL) ×3
ELECTRODE REM PT RTRN 9FT ADLT (ELECTROSURGICAL) ×1 IMPLANT
GLOVE SURG ENC MOIS LTX SZ6.5 (GLOVE) ×3 IMPLANT
GLOVE SURG UNDER POLY LF SZ6 (GLOVE) ×3 IMPLANT
GOWN STRL REUS W/ TWL LRG LVL3 (GOWN DISPOSABLE) ×2 IMPLANT
GOWN STRL REUS W/TWL LRG LVL3 (GOWN DISPOSABLE) ×4
KIT BASIN OR (CUSTOM PROCEDURE TRAY) ×3 IMPLANT
KIT TURNOVER KIT B (KITS) ×3 IMPLANT
MARKER SKIN DUAL TIP RULER LAB (MISCELLANEOUS) ×3 IMPLANT
MESH PROLENE PML 12X12 (Mesh General) ×3 IMPLANT
NEEDLE HYPO 25GX1X1/2 BEV (NEEDLE) ×3 IMPLANT
NS IRRIG 1000ML POUR BTL (IV SOLUTION) ×3 IMPLANT
PACK GENERAL/GYN (CUSTOM PROCEDURE TRAY) ×3 IMPLANT
PAD ARMBOARD 7.5X6 YLW CONV (MISCELLANEOUS) ×6 IMPLANT
PENCIL SMOKE EVACUATOR (MISCELLANEOUS) ×3 IMPLANT
RETAINER VISCERA MED (MISCELLANEOUS) ×3 IMPLANT
SUT MNCRL AB 4-0 PS2 18 (SUTURE) ×6 IMPLANT
SUT NOVA 1 T20/GS 25DT (SUTURE) IMPLANT
SUT NOVA NAB DX-16 0-1 5-0 T12 (SUTURE) ×6 IMPLANT
SUT PDS AB 1 CTX 36 (SUTURE) ×12 IMPLANT
SUT SILK 3 0 SH CR/8 (SUTURE) ×3 IMPLANT
SUT VIC AB 3-0 SH 27 (SUTURE) ×2
SUT VIC AB 3-0 SH 27XBRD (SUTURE) ×1 IMPLANT
SYR CONTROL 10ML LL (SYRINGE) ×3 IMPLANT
TOWEL GREEN STERILE (TOWEL DISPOSABLE) ×3 IMPLANT
TOWEL GREEN STERILE FF (TOWEL DISPOSABLE) ×3 IMPLANT
TRAY FOLEY MTR SLVR 14FR STAT (SET/KITS/TRAYS/PACK) IMPLANT
WATER STERILE IRR 1000ML POUR (IV SOLUTION) ×3 IMPLANT

## 2021-06-03 NOTE — Anesthesia Procedure Notes (Signed)
Procedure Name: Intubation Date/Time: 06/03/2021 1:29 PM Performed by: Barrington Ellison, CRNA Pre-anesthesia Checklist: Patient identified, Emergency Drugs available, Suction available and Patient being monitored Patient Re-evaluated:Patient Re-evaluated prior to induction Oxygen Delivery Method: Circle System Utilized Preoxygenation: Pre-oxygenation with 100% oxygen Induction Type: IV induction Ventilation: Mask ventilation without difficulty Laryngoscope Size: Mac and 3 Grade View: Grade I Tube type: Oral Tube size: 7.0 mm Number of attempts: 1 Airway Equipment and Method: Stylet and Oral airway Placement Confirmation: ETT inserted through vocal cords under direct vision, positive ETCO2 and breath sounds checked- equal and bilateral Secured at: 22 cm Tube secured with: Tape Dental Injury: Teeth and Oropharynx as per pre-operative assessment

## 2021-06-03 NOTE — Progress Notes (Signed)
Patient admitted to 6N29 from ED. Alert and oriented x4. Vital signs within normal limits. Scheduled for surgery this am. Spouse at bedside.

## 2021-06-03 NOTE — Discharge Instructions (Signed)
[facility] _______[facility]  UMBILICAL OR INGUINAL HERNIA REPAIR: POST OP INSTRUCTIONS  Always review your discharge instruction sheet given to you by the facility where your surgery was performed. IF YOU HAVE DISABILITY OR FAMILY LEAVE FORMS, YOU MUST BRING THEM TO THE OFFICE FOR PROCESSING.   DO NOT GIVE THEM TO YOUR DOCTOR.  1. A  prescription for pain medication may be given to you upon discharge.  Take your pain medication as prescribed, if needed.  If narcotic pain medicine is not needed, then you may take acetaminophen (Tylenol) or ibuprofen (Advil) as needed. 2. Take your usually prescribed medications unless otherwise directed. If you need a refill on your pain medication, please contact your pharmacy.  They will contact our office to request authorization. Prescriptions will not be filled after 5 pm or on week-ends. 3. You should follow a light diet the first 24 hours after arrival home, such as soup and crackers, etc.  Be sure to include lots of fluids daily.  Resume your normal diet the day after surgery. 4.Most patients will experience some swelling and bruising around the umbilicus or in the groin and scrotum.  Ice packs and reclining will help.  Swelling and bruising can take several days to resolve.  6. It is common to experience some constipation if taking pain medication after surgery.  Increasing fluid intake and taking a stool softener (such as Colace) will usually help or prevent this problem from occurring.  A mild laxative (Milk of Magnesia or Miralax) should be taken according to package directions if there are no bowel movements after 48 hours. 7. Unless discharge instructions indicate otherwise, you may remove your bandages 24-48 hours after surgery, and you may shower at that time.  You may have steri-strips (small skin tapes) in place directly over the incision.  These strips should be left on the skin for 7-10 days.  If your surgeon used skin glue on the  incision, you may shower in 24 hours.  The glue will flake off over the next 2-3 weeks.  Any sutures or staples will be removed at the office during your follow-up visit. 8. ACTIVITIES:  You may resume regular (light) daily activities beginning the next day--such as daily self-care, walking, climbing stairs--gradually increasing activities as tolerated.  You may have sexual intercourse when it is comfortable.  Refrain from any heavy lifting or straining until approved by your doctor.  a.You may drive when you are no longer taking prescription pain medication, you can comfortably wear a seatbelt, and you can safely maneuver your car and apply brakes. b.RETURN TO WORK:   _____________________________________________  9.You should see your doctor in the office for a follow-up appointment approximately 2-3 weeks after your surgery.  Make sure that you call for this appointment within a day or two after you arrive home to insure a convenient appointment time. 10.OTHER INSTRUCTIONS: _________________________    _____________________________________  WHEN TO CALL YOUR DOCTOR: Fever over 101.0 Inability to urinate Nausea and/or vomiting Extreme swelling or bruising Continued bleeding from incision. Increased pain, redness, or drainage from the incision  The clinic staff is available to answer your questions during regular business hours.  Please don't hesitate to call and ask to speak to one of the nurses for clinical concerns.  If you have a medical emergency, go to the nearest emergency room or call 911.  A surgeon from [facility] is always on call at the hospital   [address], [address],   [place], [place]  [address] ?  [address] [address], [place], [place]   [address] [phone] ? [phone] ? FAX [phone] Web site: [email or web address]  

## 2021-06-03 NOTE — H&P (Signed)
Reason for Consult/Chief Complaint: ventral hernia Consultant: Particia Nearing, MD  Annette Spears is an 50 y.o. female.   HPI: 35F with acute onset abdominal pain that began 8/16 AM while walking to court for jury duty. +nausea, no vomiting, last BM 8/16 at 0700, normal in quality.Colonoscopy ~2019, polypectomy. TAH 2001, lap chole early 2000s. Lives with husband and 17yo son.   Past Medical History:  Diagnosis Date   Anxiety    Depression    Irregular heart beat    TBI (traumatic brain injury) [facility])     Past Surgical History:  Procedure Laterality Date   ABDOMINAL HYSTERECTOMY     CHOLECYSTECTOMY      Family History  Adopted: Yes    Social History:  reports that she has quit smoking. Her smoking use included cigarettes. She has never used smokeless tobacco. She reports that she does not currently use alcohol. She reports that she does not currently use drugs.  Allergies:  Allergies  Allergen Reactions   Geodon [Ziprasidone Hcl]    Lithium    Risperdal [Risperidone]     Medications: I have reviewed the patient's current medications.  Results for orders placed or performed during the hospital encounter of 06/02/21 (from the past 48 hour(s))  Comprehensive metabolic panel     Status: Abnormal   Collection Time: 06/02/21  8:46 PM  Result Value Ref Range   Sodium 140 135 - 145 mmol/L   Potassium 3.9 3.5 - 5.1 mmol/L   Chloride 101 98 - 111 mmol/L   CO2 29 22 - 32 mmol/L   Glucose, Bld 103 (H) 70 - 99 mg/dL    Comment: Glucose reference range applies only to samples taken after fasting for at least 8 hours.   BUN 32 (H) 6 - 20 mg/dL   Creatinine, Ser 4.65 0.44 - 1.00 mg/dL   Calcium 03.5 8.9 - 46.5 mg/dL   Total Protein 8.2 (H) 6.5 - 8.1 g/dL   Albumin 4.2 3.5 - 5.0 g/dL   AST 21 15 - 41 U/L   ALT 33 0 - 44 U/L   Alkaline Phosphatase 95 38 - 126 U/L   Total Bilirubin 0.3 0.3 - 1.2 mg/dL   GFR, Estimated >68 >12 mL/min    Comment: (NOTE) Calculated using the CKD-EPI  Creatinine Equation (2021)    Anion gap 10 5 - 15    Comment: Performed at [facility], [address] [place], [place] [address]  Lipase, blood     Status: Abnormal   Collection Time: 06/02/21  8:46 PM  Result Value Ref Range   Lipase <10 (L) 11 - 51 U/L    Comment: Performed at [facility], [address] [place], [place] [address]  CBC with Diff     Status: Abnormal   Collection Time: 06/02/21  8:46 PM  Result Value Ref Range   WBC 11.7 (H) 4.0 - 10.5 K/uL   RBC 5.27 (H) 3.87 - 5.11 MIL/uL   Hemoglobin 15.1 (H) 12.0 - 15.0 g/dL   HCT 44.9 (H) 67.5 - 91.6 %   MCV 87.7 80.0 - 100.0 fL   MCH 28.7 26.0 - 34.0 pg   MCHC 32.7 30.0 - 36.0 g/dL   RDW 38.4 66.5 - 99.3 %   Platelets 350 150 - 400 K/uL   nRBC 0.0 0.0 - 0.2 %   Neutrophils Relative % 69 %   Neutro Abs 8.1 (H) 1.7 - 7.7 K/uL   Lymphocytes Relative 22 %  Lymphs Abs 2.6 0.7 - 4.0 K/uL   Monocytes Relative 6 %   Monocytes Absolute 0.7 0.1 - 1.0 K/uL   Eosinophils Relative 2 %   Eosinophils Absolute 0.2 0.0 - 0.5 K/uL   Basophils Relative 1 %   Basophils Absolute 0.1 0.0 - 0.1 K/uL   Immature Granulocytes 0 %   Abs Immature Granulocytes 0.03 0.00 - 0.07 K/uL    Comment: Performed at [facility], [address], [place], [place] [address]  Resp Panel by RT-PCR (Flu A&B, Covid) Nasopharyngeal Swab     Status: None   Collection Time: 06/02/21 11:21 PM   Specimen: Nasopharyngeal Swab; Nasopharyngeal(NP) swabs in vial transport medium  Result Value Ref Range   SARS Coronavirus 2 by RT PCR NEGATIVE NEGATIVE    Comment: (NOTE) SARS-CoV-2 target nucleic acids are NOT DETECTED.  The SARS-CoV-2 RNA is generally detectable in upper respiratory specimens during the acute phase of infection. The lowest concentration of SARS-CoV-2 viral copies this assay can detect is 138 copies/mL. A negative result does not preclude SARS-Cov-2 infection and should not be used as  the sole basis for treatment or other patient management decisions. A negative result may occur with  improper specimen collection/handling, submission of specimen other than nasopharyngeal swab, presence of viral mutation(s) within the areas targeted by this assay, and inadequate number of viral copies(<138 copies/mL). A negative result must be combined with clinical observations, patient history, and epidemiological information. The expected result is Negative.  Fact Sheet for Patients:  BloggerCourse.com  Fact Sheet for Healthcare Providers:  SeriousBroker.it  This test is no t yet approved or cleared by the [place] FDA and  has been authorized for detection and/or diagnosis of SARS-CoV-2 by FDA under an Emergency Use Authorization (EUA). This EUA will remain  in effect (meaning this test can be used) for the duration of the COVID-19 declaration under Section 564(b)(1) of the Act, 21 U.S.C.section 360bbb-3(b)(1), unless the authorization is terminated  or revoked sooner.       Influenza A by PCR NEGATIVE NEGATIVE   Influenza B by PCR NEGATIVE NEGATIVE    Comment: (NOTE) The Xpert Xpress SARS-CoV-2/FLU/RSV plus assay is intended as an aid in the diagnosis of influenza from Nasopharyngeal swab specimens and should not be used as a sole basis for treatment. Nasal washings and aspirates are unacceptable for Xpert Xpress SARS-CoV-2/FLU/RSV testing.  Fact Sheet for Patients: BloggerCourse.com  Fact Sheet for Healthcare Providers: SeriousBroker.it  This test is not yet approved or cleared by the [place] FDA and has been authorized for detection and/or diagnosis of SARS-CoV-2 by FDA under an Emergency Use Authorization (EUA). This EUA will remain in effect (meaning this test can be used) for the duration of the COVID-19 declaration under Section 564(b)(1) of the Act, 21  U.S.C. section 360bbb-3(b)(1), unless the authorization is terminated or revoked.  Performed at [facility], [address], [place], [place] [address]     CT ABDOMEN PELVIS W CONTRAST  Result Date: 06/02/2021 CLINICAL DATA:  Nonlocalized acute abdominal pain. Right lower quadrant pain nausea the started this morning. EXAM: CT ABDOMEN AND PELVIS WITH CONTRAST TECHNIQUE: Multidetector CT imaging of the abdomen and pelvis was performed using the standard protocol following bolus administration of intravenous contrast. CONTRAST:  2mL OMNIPAQUE IOHEXOL 350 MG/ML SOLN COMPARISON:  CT angiography chest 01/13/2020 FINDINGS: Lower chest: No acute abnormality. Hepatobiliary: No focal liver abnormality. Status post cholecystectomy. No biliary dilatation. Pancreas: No focal lesion. Normal pancreatic contour. No  surrounding inflammatory changes. No main pancreatic ductal dilatation. Spleen: Normal in size without focal abnormality. Adrenals/Urinary Tract: No adrenal nodule bilaterally. Bilateral kidneys enhance symmetrically. No hydronephrosis. No hydroureter. The urinary bladder is unremarkable. Stomach/Bowel: Stomach is within normal limits. No evidence of small bowel wall thickening or dilatation. Large ventral wall hernia containing a large segment of the transverse colon with associated transition point at the exit site and partial distal decompression of the distal colon. Ascending colon and proximal transverse colon filled with stool and gas. Associated trace fat stranding within the umbilical hernia concerning for developing ischemia of the mesentery. No pneumatosis. No large bowel wall thickening. Appendix appears normal. Vascular/Lymphatic: No abdominal aorta or iliac aneurysm. No abdominal, pelvic, or inguinal lymphadenopathy. Reproductive: Status post hysterectomy. No adnexal masses. Other: No intraperitoneal free fluid. No intraperitoneal free gas. No organized fluid collection.  Musculoskeletal: No suspicious lytic or blastic osseous lesions. No acute displaced fracture. Multilevel degenerative changes of the spine. IMPRESSION: Question early/partial large bowel obstruction with transition point at the exit site of a large paraumbilical hernia containing a segment of transverse colon. Associated trace fat stranding within the umbilical hernia-correlate clinically for incarceration. No associated bowel perforation. Electronically Signed   By: Tish Frederickson M.D.   On: 06/02/2021 22:32    ROS 10 point review of systems is negative except as listed above in HPI.   Physical Exam Blood pressure 123/82, pulse (!) 109, temperature 98.4 F (36.9 C), temperature source Oral, resp. rate 17, height 4\' 11"  (1.499 m), weight 98.4 kg, last menstrual period 12/25/2018, SpO2 94 %. Constitutional: well-developed, well-nourished HEENT: pupils equal, round, reactive to light, 100mm b/l, moist conjunctiva, external inspection of ears and nose normal, hearing intact Oropharynx: normal oropharyngeal mucosa, normal dentition Neck: no thyromegaly, trachea midline, no midline cervical tenderness to palpation Chest: breath sounds equal bilaterally, normal respiratory effort, no midline or lateral chest wall tenderness to palpation/deformity Abdomen: soft, obese, large supraumbilical hernia that is TTP and full without erythema or induration, +bruising, no hepatosplenomegaly GU: normal female genitalia  Back: no wounds, no thoracic/lumbar spine tenderness to palpation, no thoracic/lumbar spine stepoffs Rectal: deferred Extremities: 2+ radial and pedal pulses bilaterally, motor and sensation intact to bilateral UE and LE, no peripheral edema MSK: unable to assess gait/station, no clubbing/cyanosis of fingers/toes, normal ROM of all four extremities Skin: warm, dry, no rashes   Assessment/Plan: 37F with incarcerated ventral hernia  Imaging reviewed and discussed with Dr. 3m. Hernia neck has  wide mouth, sx <24h and patient non-toxic in appearance. TTP over hernia, but no erythema/induration. Mild leukocytosis to 11.6, LA pending. Hydrate, admit, and plan for OR later this AM.    Luisa Hart, MD General and Trauma Surgery [facility]

## 2021-06-03 NOTE — Progress Notes (Signed)
Pacu RN Report to floor given  Gave report to [facility] RN. [phone]. Discussed surgery, meds given in OR and Pacu, VS, IV fluids given, EBL, urine output, pain and other pertinent information. Also discussed if pt had any family or friends here or belongings with them.   Pt exits my care.

## 2021-06-03 NOTE — Plan of Care (Signed)
  Problem: Education: Goal: Knowledge of General Education information will improve Description Including pain rating scale, medication(s)/side effects and non-pharmacologic comfort measures Outcome: Progressing   

## 2021-06-03 NOTE — ED Notes (Signed)
Care assumed in [facility]. Pt A&O4, c/o continued 10/10 RUQ abd pain. Per [facility], pt placed on 2L [place] during transport, O2 not needed on arrival. Pt RA, O2 sats 94%

## 2021-06-03 NOTE — ED Provider Notes (Signed)
Received as a transfer for surgical consultation.  Patient complaining of persistent 10 out of 10 abdominal pain, just above the umbilicus.  Examination reveals tender tense area above the umbilicus.  Will provide additional analgesia, reconsult general surgery now that she is at [facility].   Gilda Crease, MD 06/03/21 [phone]

## 2021-06-03 NOTE — ED Notes (Signed)
Informed consent signed by Bedelia Person MD & pt. Original placed in Med Rec drawer, copy scanned in chart & copy at bedside.

## 2021-06-03 NOTE — ED Notes (Signed)
Assumed patient care, is resting, oxygen reading 90, started on 2 ltrs.

## 2021-06-03 NOTE — Transfer of Care (Signed)
Immediate Anesthesia Transfer of Care Note  Patient: Annette Spears  Procedure(s) Performed: HERNIA REPAIR VENTRAL ADULT  Patient Location: PACU  Anesthesia Type:General  Level of Consciousness: drowsy and patient cooperative  Airway & Oxygen Therapy: Patient Spontanous Breathing and non-rebreather face mask  Post-op Assessment: Report given to RN  Post vital signs: Reviewed and stable  Last Vitals:  Vitals Value Taken Time  BP 117/68 06/03/21 1543  Temp    Pulse 98 06/03/21 1548  Resp 18 06/03/21 1548  SpO2 93 % 06/03/21 1548  Vitals shown include unvalidated device data.  Last Pain:  Vitals:   06/03/21 1300  TempSrc: Oral  PainSc:          Complications: No notable events documented.

## 2021-06-03 NOTE — Op Note (Signed)
06/03/2021  3:08 PM  PATIENT:  Annette Spears  49 y.o. female  PRE-OPERATIVE DIAGNOSIS:  incarcerated ventral hernia  POST-OPERATIVE DIAGNOSIS:  incarcerated ventral hernia  PROCEDURE:  Procedure(s): Bilateral retrorectus hernia repair with mesh and bilateral muscle advancement flaps  SURGEON:  Surgeon(s) and Role:    Axel Filler, MD - Primary  PHYSICIAN ASSISTANT: Leary Roca, PA-C   ANESTHESIA:   local, regional, and general  EBL:  150 mL   BLOOD ADMINISTERED:none  DRAINS: none   LOCAL MEDICATIONS USED:  OTHER exparel  SPECIMEN:  No Specimen  DISPOSITION OF SPECIMEN:  N/A  COUNTS:  YES  TOURNIQUET:  * No tourniquets in log *  DICTATION: .Dragon Dictation After the patient was consented he was taken back to the operating room and placed in the supine position with bilateral SCDs in place. The patient was prepped and draped in usual sterile fashion. Antibiotics were confirmed and timeout was called and all facts verified.  . I proceeded to use electrocautery to maintain hemostasis and dissection took place in the most superior portion of the wound down to the subcutaneous tissues fat to the anterior fascia. This was incised. The fascia was elevated and 2 Kocher clamps.  The hernia sac and the abdominal cavity were entered bluntly. There appeared to be some omentum within the midline in the superior portion of the wound. This was carefully dissected away from the abdominal wall. The omentum lay in the midline, this was taken down with blunt and sharp dissection circumferentially to the incision. The hernia was seen to be approximately 4-5 cm.    At this time I proceeded to retract is rectus muscles medially.  At this time the posterior fascia was then incised.  Using blunt dissection the belly was dissected away from the posterior rectus fascia.  This was done inferiorly and superiorly.  At the midline inferior and superior portions of the linea alba this was taken down  from the anterior abdominal wall.  This allowed me to advance the muscle medially to the midline without undue tension. This was done bilaterally.  The area was checked for hemostasis.  At this time the posterior rectus fascia was reapproximated using #1 PDS in a standard running fashion x2.  I proceeded to irrigate out the retrorectus space.  The area was measured and seemed to be approximately 20x15cm cm in size.  A piece of prolene mesh was cut and was selected and placed into the retrorectus space.  This fit well and flat.  #1 novafils were used via stab incisions and an endoclose bilaterally x2 and superiorly and inferiorly x2.   At this time the anterior fascia and midline were reapproximated using #1 PDS in a standard running fashion x2.  The subcutaneous tissue was irrigated out with sterile saline.  The skin was then reapproximated using 3-0 Monocryl in subcuticular fashion.  The midline wound was then dressed with honeycomb dressing.  The patient tolerated procedure well was taken to the recovery room in stable condition.   PLAN OF CARE: Admit to inpatient   PATIENT DISPOSITION:  PACU - hemodynamically stable.   Delay start of Pharmacological VTE agent (>24hrs) due to surgical blood loss or risk of bleeding: yes

## 2021-06-03 NOTE — Anesthesia Preprocedure Evaluation (Signed)
Anesthesia Evaluation  Patient identified by MRN, date of birth, ID band Patient awake    Reviewed: Allergy & Precautions, NPO status , Patient's Chart, lab work & pertinent test results  Airway Mallampati: II  TM Distance: >3 FB Neck ROM: Full    Dental  (+) Teeth Intact, Dental Advisory Given   Pulmonary former smoker,    Pulmonary exam normal breath sounds clear to auscultation       Cardiovascular negative cardio ROS Normal cardiovascular exam Rhythm:Regular Rate:Normal     Neuro/Psych PSYCHIATRIC DISORDERS Anxiety Depression TBI    GI/Hepatic Neg liver ROS, GERD  Medicated,incarcerated ventral hernia   Endo/Other  Hypothyroidism Morbid obesity  Renal/GU negative Renal ROS     Musculoskeletal negative musculoskeletal ROS (+)   Abdominal   Peds  Hematology negative hematology ROS (+)   Anesthesia Other Findings Day of surgery medications reviewed with the patient.  Reproductive/Obstetrics                             Anesthesia Physical Anesthesia Plan  ASA: 3  Anesthesia Plan: General   Post-op Pain Management:    Induction: Intravenous  PONV Risk Score and Plan: 4 or greater and Midazolam, Dexamethasone and Ondansetron  Airway Management Planned: Oral ETT  Additional Equipment:   Intra-op Plan:   Post-operative Plan: Extubation in OR  Informed Consent: I have reviewed the patients History and Physical, chart, labs and discussed the procedure including the risks, benefits and alternatives for the proposed anesthesia with the patient or authorized representative who has indicated his/her understanding and acceptance.     Dental advisory given  Plan Discussed with: CRNA  Anesthesia Plan Comments:         Anesthesia Quick Evaluation

## 2021-06-04 ENCOUNTER — Encounter (HOSPITAL_COMMUNITY): Payer: Self-pay | Admitting: General Surgery

## 2021-06-04 LAB — CBC
HCT: 41.2 % (ref 36.0–46.0)
Hemoglobin: 13.4 g/dL (ref 12.0–15.0)
MCH: 29 pg (ref 26.0–34.0)
MCHC: 32.5 g/dL (ref 30.0–36.0)
MCV: 89.2 fL (ref 80.0–100.0)
Platelets: 321 10*3/uL (ref 150–400)
RBC: 4.62 MIL/uL (ref 3.87–5.11)
RDW: 15.2 % (ref 11.5–15.5)
WBC: 11.3 10*3/uL — ABNORMAL HIGH (ref 4.0–10.5)
nRBC: 0 % (ref 0.0–0.2)

## 2021-06-04 LAB — BASIC METABOLIC PANEL
Anion gap: 11 (ref 5–15)
BUN: 12 mg/dL (ref 6–20)
CO2: 28 mmol/L (ref 22–32)
Calcium: 9.4 mg/dL (ref 8.9–10.3)
Chloride: 100 mmol/L (ref 98–111)
Creatinine, Ser: 0.76 mg/dL (ref 0.44–1.00)
GFR, Estimated: >60 mL/min (ref >60)
Glucose, Bld: 103 mg/dL — ABNORMAL HIGH (ref 70–99)
Potassium: 4 mmol/L (ref 3.5–5.1)
Sodium: 139 mmol/L (ref 135–145)

## 2021-06-04 LAB — HIV ANTIBODY (ROUTINE TESTING W REFLEX): HIV Screen 4th Generation wRfx: NONREACTIVE

## 2021-06-04 MED ORDER — DOCUSATE SODIUM 100 MG PO CAPS: 100.0000 mg | ORAL_CAPSULE | Freq: Two times a day (BID) | ORAL | 0 refills | Status: AC | PRN

## 2021-06-04 MED ORDER — OXYCODONE HCL 5 MG PO TABS: 5.0000 mg | ORAL_TABLET | Freq: Four times a day (QID) | ORAL | 0 refills | Status: DC | PRN

## 2021-06-04 MED ORDER — ACETAMINOPHEN 500 MG PO TABS: 1000.0000 mg | ORAL_TABLET | Freq: Three times a day (TID) | ORAL | 0 refills | Status: AC | PRN

## 2021-06-04 MED ORDER — LEVOTHYROXINE SODIUM 50 MCG PO TABS
50.0000 ug | ORAL_TABLET | Freq: Every day | ORAL | Status: DC
  Administered 2021-06-04: 50 ug via ORAL
  Filled 2021-06-04: qty 1

## 2021-06-04 MED ORDER — POLYETHYLENE GLYCOL 3350 17 G PO PACK: 17.0000 g | PACK | Freq: Every day | ORAL | 0 refills | Status: AC | PRN

## 2021-06-04 MED ORDER — ONDANSETRON 4 MG PO TBDP: 4.0000 mg | ORAL_TABLET | Freq: Four times a day (QID) | ORAL | 0 refills | Status: AC | PRN

## 2021-06-04 MED ORDER — METHOCARBAMOL 500 MG PO TABS: 1000.0000 mg | ORAL_TABLET | Freq: Three times a day (TID) | ORAL | 0 refills | Status: DC | PRN

## 2021-06-04 NOTE — Progress Notes (Signed)
Annette Spears to be D/C'd  per MD order.  Discussed with the patient and all questions fully answered.  VSS, Skin clean, dry and intact without evidence of skin break down, no evidence of skin tears noted.  IV catheter discontinued intact. Site without signs and symptoms of complications. Dressing and pressure applied.  An After Visit Summary was printed and given to the patient.  D/c education completed with patient/family including follow up instructions, medication list, d/c activities limitations if indicated, with other d/c instructions as indicated by MD - patient able to verbalize understanding, all questions fully answered. Given by charge nurse Aurea Graff.  Patient instructed to return to ED, call 911, or call MD for any changes in condition.   Patient to be escorted via WC, and D/C home via private auto.

## 2021-06-04 NOTE — Anesthesia Postprocedure Evaluation (Signed)
Anesthesia Post Note  Patient: Wealthy Hazan  Procedure(s) Performed: HERNIA REPAIR VENTRAL ADULT     Patient location during evaluation: PACU Anesthesia Type: General Level of consciousness: awake and alert, oriented and patient cooperative Pain management: pain level controlled Vital Signs Assessment: post-procedure vital signs reviewed and stable Respiratory status: spontaneous breathing, nonlabored ventilation and respiratory function stable Cardiovascular status: blood pressure returned to baseline and stable Postop Assessment: no apparent nausea or vomiting Anesthetic complications: no   No notable events documented.  Last Vitals:  Vitals:   06/04/21 0015 06/04/21 0448  BP: 118/72 114/66  Pulse: 99 96  Resp:  18  Temp: 37.1 C 36.9 C  SpO2: 96% 91%    Last Pain:  Vitals:   06/04/21 0448  TempSrc: Oral  PainSc:    Pain Goal: Patients Stated Pain Goal: 2 (06/04/21 0218)                 Lannie Fields

## 2021-06-04 NOTE — Discharge Summary (Signed)
Patient ID: Annette Spears [id number] [birth year] 50 y.o.  Admit date: 06/02/2021 Discharge date: 06/04/2021  Admitting Diagnosis: Incarcerated ventral hernia  Discharge Diagnosis Incarcerated ventral hernia  Consultants None   Reason for Admission: 81F with acute onset abdominal pain that began 8/16 AM while walking to court for jury duty. +nausea, no vomiting, last BM 8/16 at 0700, normal in quality. Colonoscopy ~2019, polypectomy. TAH 2001, lap chole early 2000s. Lives with husband and 17yo son.   Procedures Dr. Derrell Lolling - Bilateral retrorectus hernia repair with mesh and bilateral muscle advancement flaps - 06/03/2021  Hospital Course:  Patient presented as above and was found to have an incarcerated ventral hernia. She was taken to the OR by Dr. Derrell Lolling on 8/17 where she underwent Bilateral retrorectus hernia repair with mesh and bilateral muscle advancement flaps. Patient tolerated the procedure well and was transferred back to the floor. Her diet was advanced and tolerated. She mobilized in the halls. .On POD1, the patient was voiding well, tolerating diet, ambulating well, pain well controlled, vital signs stable, incisions c/d/i and felt stable for discharge home. Return precaution discussed. She is to follow up as noted below.  Rn reports that pulse ox that was low was inaccurate as they could not get a good waveform. They retook this in the am and o2 sats > 95%. Patient denies cp or sob.   Physical Exam: Blood pressure 126/70, pulse (!) 105, temperature 98.3 F (36.8 C), temperature source Oral, resp. rate 18, height 4\' 11"  (1.499 m), weight 98.4 kg, last menstrual period 12/25/2018, SpO2 96 %. Gen:  Alert, NAD, pleasant HEENT: EOM's intact, pupils equal and round Card:  RRR Pulm:  CTAB, no W/R/R, effort normal Abd: Soft, ND appropriately tender around incisions without peritonitis. Otherwise NT. +BS. Incisions with glue in place and c/d/i Ext:  No LE edema Psych: A&Ox3   Skin: no rashes noted, warm and dry   Allergies as of 06/04/2021       Reactions   Risperdal [risperidone] Anaphylaxis, Shortness Of Breath   Ziprasidone Hcl Other (See Comments)   Causes patient to have dry mouth, and other stroke related symptoms  Facial contracture   Lithium Other (See Comments)   Causes patient to have tremors         Medication List     TAKE these medications    acetaminophen 500 MG tablet Commonly known as: TYLENOL Take 2 tablets (1,000 mg total) by mouth every 8 (eight) hours as needed.   benztropine 1 MG tablet Commonly known as: COGENTIN Take 0.5-1 mg by mouth 2 (two) times daily. TAKE 1/2 TABLETS BY MOUTH IN THE MORNING AND 1 TABLET AT NIGHT.   docusate sodium 100 MG capsule Commonly known as: COLACE Take 1 capsule (100 mg total) by mouth 2 (two) times daily as needed for mild constipation.   levothyroxine 50 MCG tablet Commonly known as: SYNTHROID Take 50 mcg by mouth daily.   lurasidone 80 MG Tabs tablet Commonly known as: LATUDA Take 1 tablet (80 mg total) by mouth at bedtime.   Latuda 120 MG Tabs Generic drug: Lurasidone HCl Take 120 mg by mouth daily.   methocarbamol 500 MG tablet Commonly known as: ROBAXIN Take 2 tablets (1,000 mg total) by mouth every 8 (eight) hours as needed for muscle spasms.   mirtazapine 7.5 MG tablet Commonly known as: REMERON Take 1 tablet (7.5 mg total) by mouth at bedtime.   omeprazole 40 MG capsule Commonly known as: PRILOSEC Take 40 mg  by mouth every morning.   ondansetron 4 MG disintegrating tablet Commonly known as: ZOFRAN-ODT Take 1 tablet (4 mg total) by mouth every 6 (six) hours as needed for nausea.   oxyCODONE 5 MG immediate release tablet Commonly known as: Oxy IR/ROXICODONE Take 1 tablet (5 mg total) by mouth every 6 (six) hours as needed for breakthrough pain.   polyethylene glycol 17 g packet Commonly known as: MIRALAX / GLYCOLAX Take 17 g by mouth daily as needed for mild  constipation.   promethazine 25 MG tablet Commonly known as: PHENERGAN Take 25 mg by mouth every 8 (eight) hours as needed for nausea/vomiting.   QUEtiapine 300 MG tablet Commonly known as: SEROQUEL Take 300 mg by mouth at bedtime.   sertraline 100 MG tablet Commonly known as: ZOLOFT Take 200 mg by mouth daily.   SUMAtriptan 50 MG tablet Commonly known as: IMITREX Take 50 mg by mouth daily as needed for migraine.   Wellbutrin XL 150 MG 24 hr tablet Generic drug: buPROPion Take 450 mg by mouth daily.   zolpidem 12.5 MG CR tablet Commonly known as: AMBIEN CR Take 12.5 mg by mouth daily.          Follow-up Information     Axel Filler, MD. Call in 1 day(s).   Specialty: General Surgery Why: Please call and confirm your appointment time. We are working hard to make this for you. Please bring a copy of your photo ID and insurance card. Please arrive 30-45 minutes prior to your appointment for paperwork. Contact information: [address] [place] [address] [phone]         [facility] Follow up.   Why: Please use the back of your insurance card to find a Primary Care Provider in network for follow up. If you do not have insurance you can follow up at [facility] who see's patients even without insurance. Contact information: [address] [place] [address] [phone]                Signed: Leary Roca, [facility] 06/04/2021, 10:16 AM Please see Amion for pager number during day hours 7:00am-4:30pm

## 2021-06-07 ENCOUNTER — Encounter (HOSPITAL_COMMUNITY): Payer: Self-pay | Admitting: General Surgery

## 2021-06-15 ENCOUNTER — Encounter (HOSPITAL_COMMUNITY): Payer: Self-pay | Admitting: General Surgery

## 2021-11-09 IMAGING — CT CT ABD-PELV W/ CM
2 of 5 series · 16 of 46 positions shown, 18 images · IV contrast (APPLIED)
Comparison: CT angiography chest 01/13/2020

CLINICAL DATA: Nonlocalized acute abdominal pain. Right lower
quadrant pain nausea the started this morning.

EXAM:
CT ABDOMEN AND PELVIS WITH CONTRAST
TECHNIQUE: Multidetector CT imaging of the abdomen and pelvis was performed
using the standard protocol following bolus administration of
intravenous contrast.
CONTRAST:  75mL OMNIPAQUE IOHEXOL 350 MG/ML SOLN

[Series 2: abd pel w · axial · 0.87mm/px · z∈[+839,+1284]mm · 13 of 99 slices shown, 15 images]
[im 5/99  soft-tissue]
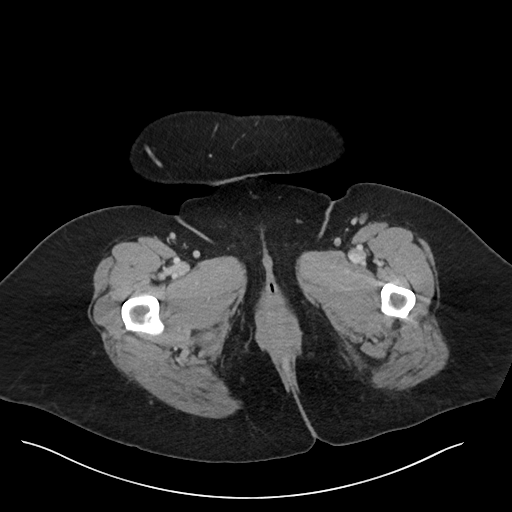
[im 5/99  bone]
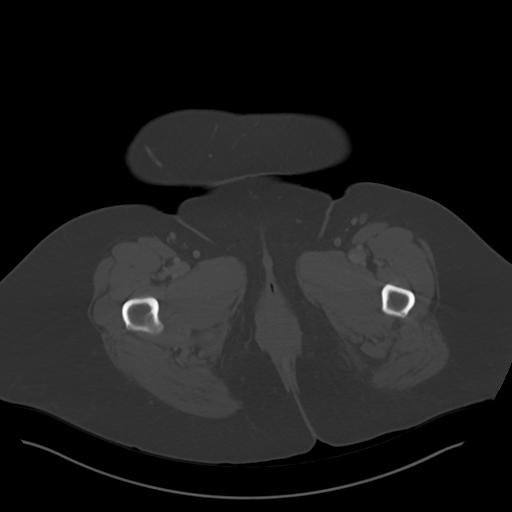
[im 15/99  soft-tissue]
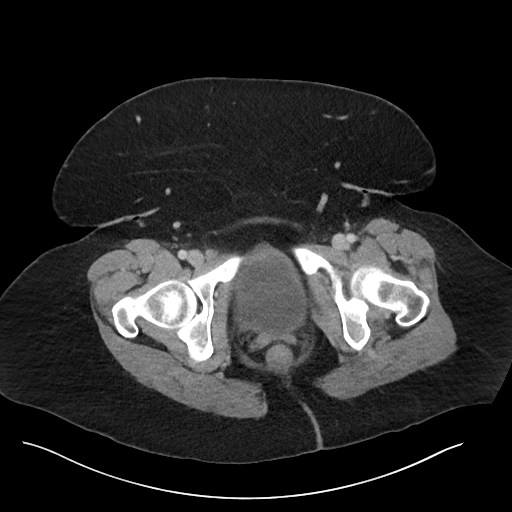
[im 20/99  soft-tissue]
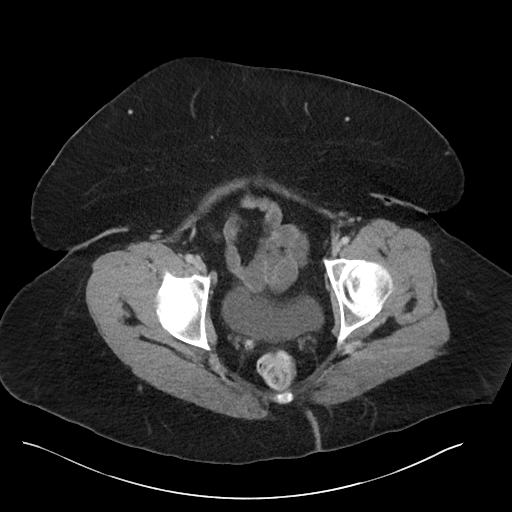
[im 30/99  soft-tissue]
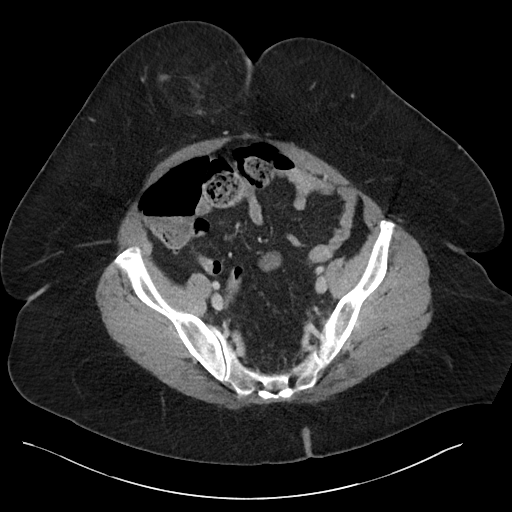
[im 35/99  soft-tissue]
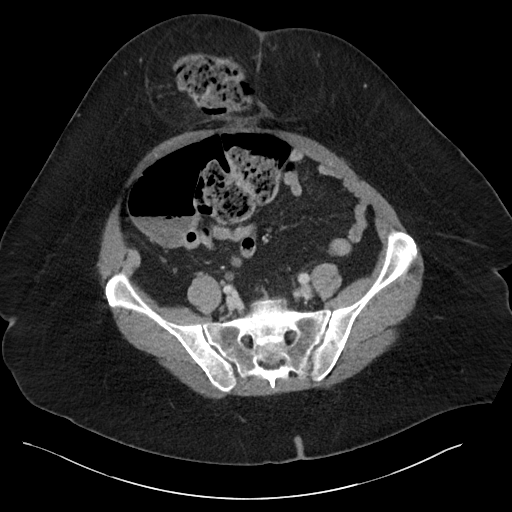
[im 45/99  soft-tissue]
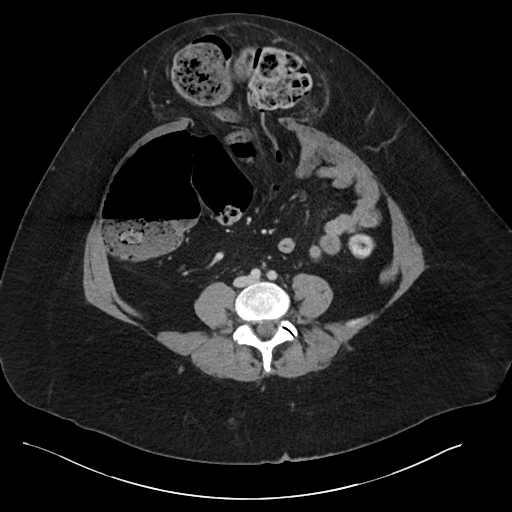
[im 50/99  soft-tissue]
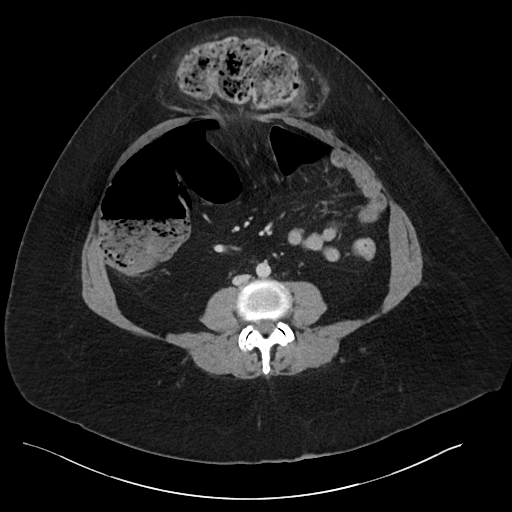
[im 54/99  soft-tissue]
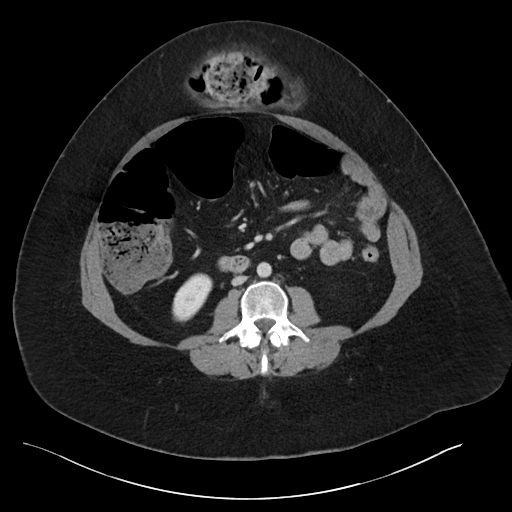
[im 64/99  soft-tissue]
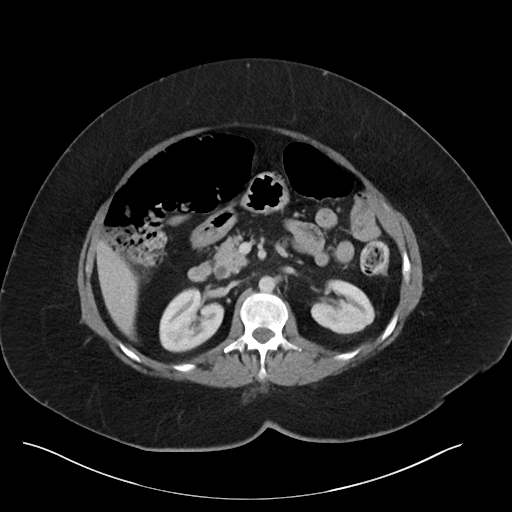
[im 64/99  bone]
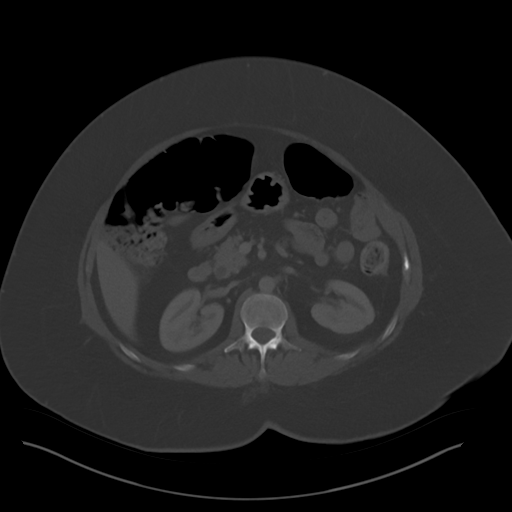
[im 69/99  soft-tissue]
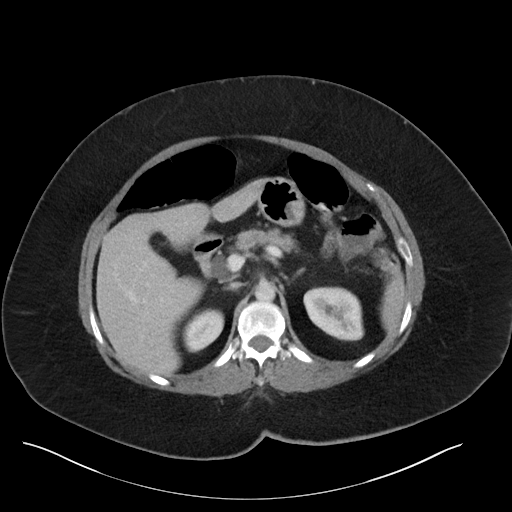
[im 79/99  soft-tissue]
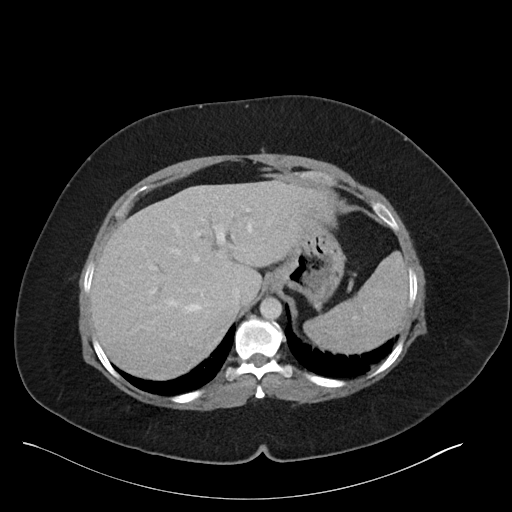
[im 84/99  soft-tissue]
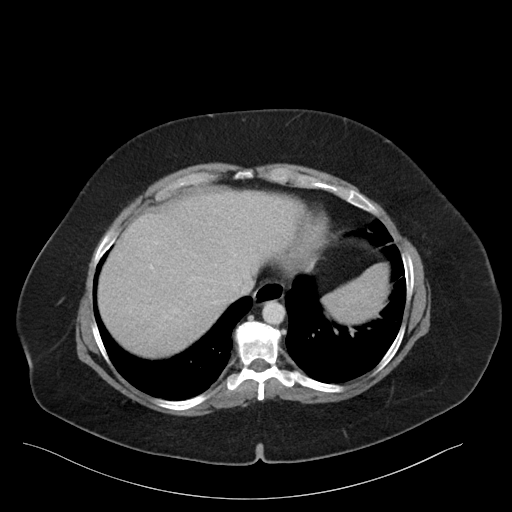
[im 94/99  soft-tissue]
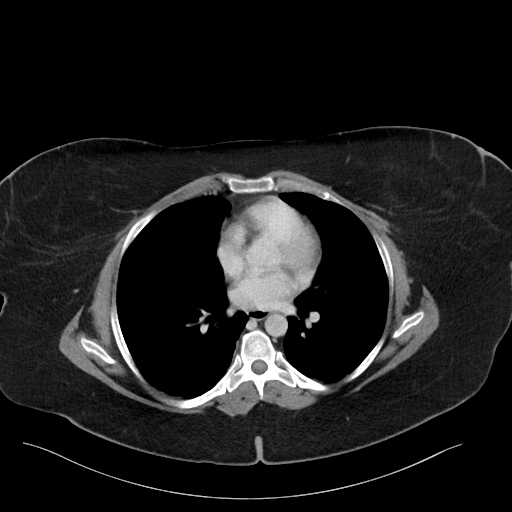

[Series 5: coronal · coronal · 0.81mm/px · 3 of 133 slices shown]
[im 45/133  soft-tissue]
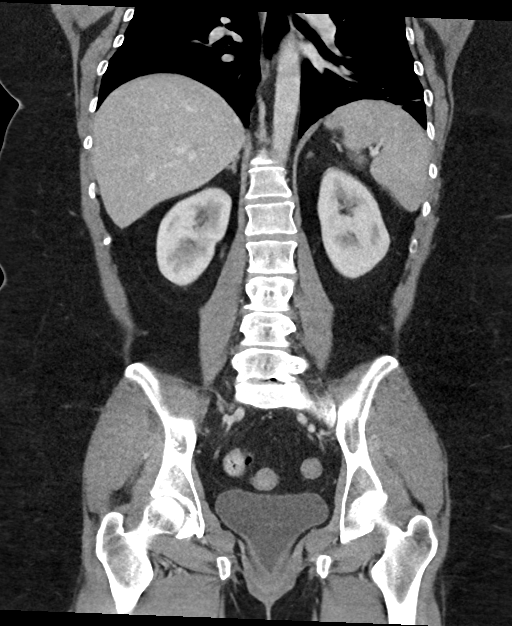
[im 59/133  soft-tissue]
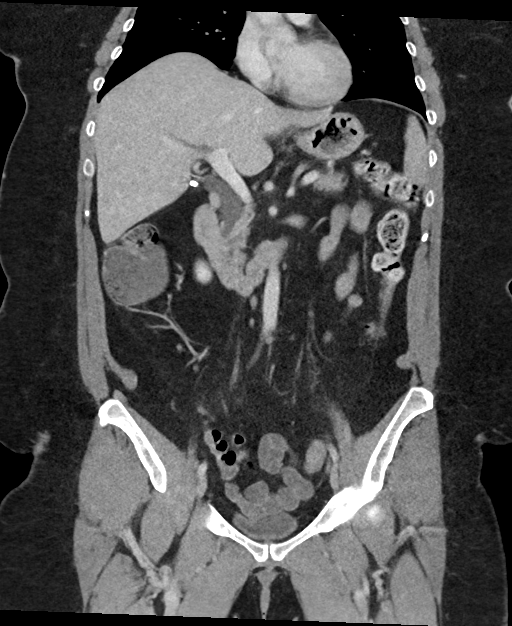
[im 74/133  soft-tissue]
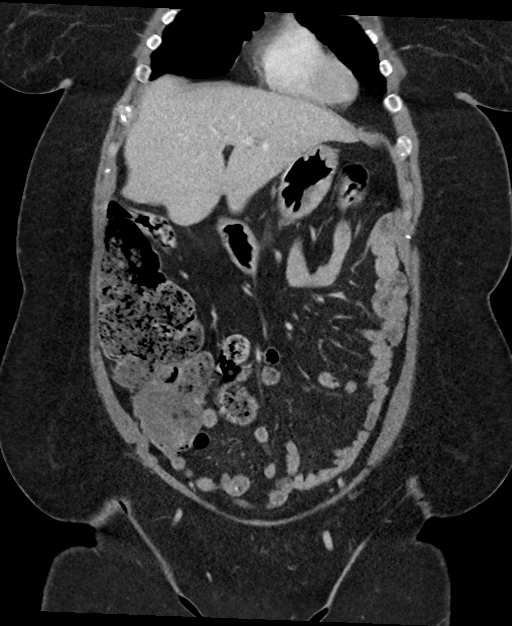

[16 of 46 positions shown; findings below may reference images not displayed]

FINDINGS: Lower chest: No acute abnormality.

Hepatobiliary: No focal liver abnormality. Status post
cholecystectomy. No biliary dilatation.

Pancreas: No focal lesion. Normal pancreatic contour. No surrounding
inflammatory changes. No main pancreatic ductal dilatation.

Spleen: Normal in size without focal abnormality.

Adrenals/Urinary Tract:

No adrenal nodule bilaterally.

Bilateral kidneys enhance symmetrically.

No hydronephrosis. No hydroureter.

The urinary bladder is unremarkable.

Stomach/Bowel: Stomach is within normal limits. No evidence of small
bowel wall thickening or dilatation. Large ventral wall hernia
containing a large segment of the transverse colon with associated
transition point at the exit site and partial distal decompression
of the distal colon. Ascending colon and proximal transverse colon
filled with stool and gas. Associated trace fat stranding within the
umbilical hernia concerning for developing ischemia of the
mesentery. No pneumatosis. No large bowel wall thickening. Appendix
appears normal.

Vascular/Lymphatic: No abdominal aorta or iliac aneurysm. No
abdominal, pelvic, or inguinal lymphadenopathy.

Reproductive: Status post hysterectomy. No adnexal masses.

Other: No intraperitoneal free fluid. No intraperitoneal free gas.
No organized fluid collection.

Musculoskeletal:

No suspicious lytic or blastic osseous lesions. No acute displaced
fracture. Multilevel degenerative changes of the spine.
IMPRESSION: Question early/partial large bowel obstruction with transition point
at the exit site of a large paraumbilical hernia containing a
segment of transverse colon. Associated trace fat stranding within
the umbilical hernia-correlate clinically for incarceration. No
associated bowel perforation.

## 2022-04-18 ENCOUNTER — Other Ambulatory Visit: Payer: Self-pay

## 2022-04-18 ENCOUNTER — Encounter (HOSPITAL_BASED_OUTPATIENT_CLINIC_OR_DEPARTMENT_OTHER): Payer: Self-pay | Admitting: *Deleted

## 2022-04-18 ENCOUNTER — Emergency Department (HOSPITAL_BASED_OUTPATIENT_CLINIC_OR_DEPARTMENT_OTHER)
Admission: EM | Admit: 2022-04-18 | Discharge: 2022-04-19 | Disposition: A | Discharge status: Home / Self Care | Payer: BC Managed Care – PPO | Attending: Emergency Medicine | Admitting: Emergency Medicine

## 2022-04-18 DIAGNOSIS — R0789 Other chest pain: Secondary | ICD-10-CM | POA: Diagnosis not present

## 2022-04-18 DIAGNOSIS — R079 Chest pain, unspecified: Secondary | ICD-10-CM

## 2022-04-18 HISTORY — DX: Dizziness and giddiness: R42

## 2022-04-18 NOTE — ED Triage Notes (Addendum)
Pt c/o of chest pain that started earlier today. Also c/o left arm numbness. Has not taken anything for pain. C/o feeling short of breath and  and nausea. States remote history of chest pain about 10 years ago with no cause found for the pain at that time.

## 2022-04-19 ENCOUNTER — Emergency Department (HOSPITAL_BASED_OUTPATIENT_CLINIC_OR_DEPARTMENT_OTHER): Payer: BC Managed Care – PPO

## 2022-04-19 LAB — COMPREHENSIVE METABOLIC PANEL
ALT: 19 U/L (ref 0–44)
AST: 15 U/L (ref 15–41)
Albumin: 4.6 g/dL (ref 3.5–5.0)
Alkaline Phosphatase: 83 U/L (ref 38–126)
Anion gap: 8 (ref 5–15)
BUN: 26 mg/dL — ABNORMAL HIGH (ref 6–20)
CO2: 29 mmol/L (ref 22–32)
Calcium: 10.3 mg/dL (ref 8.9–10.3)
Chloride: 101 mmol/L (ref 98–111)
Creatinine, Ser: 0.86 mg/dL (ref 0.44–1.00)
GFR, Estimated: >60 mL/min (ref >60)
Glucose, Bld: 94 mg/dL (ref 70–99)
Potassium: 3.5 mmol/L (ref 3.5–5.1)
Sodium: 138 mmol/L (ref 135–145)
Total Bilirubin: 0.3 mg/dL (ref 0.3–1.2)
Total Protein: 8.4 g/dL — ABNORMAL HIGH (ref 6.5–8.1)

## 2022-04-19 LAB — CBC WITH DIFFERENTIAL/PLATELET
Abs Immature Granulocytes: 0.03 10*3/uL (ref 0.00–0.07)
Basophils Absolute: 0.1 10*3/uL (ref 0.0–0.1)
Basophils Relative: 1 %
Eosinophils Absolute: 0.3 10*3/uL (ref 0.0–0.5)
Eosinophils Relative: 3 %
HCT: 46.4 % — ABNORMAL HIGH (ref 36.0–46.0)
Hemoglobin: 14.8 g/dL (ref 12.0–15.0)
Immature Granulocytes: 0 %
Lymphocytes Relative: 29 %
Lymphs Abs: 3.2 10*3/uL (ref 0.7–4.0)
MCH: 27.8 pg (ref 26.0–34.0)
MCHC: 31.9 g/dL (ref 30.0–36.0)
MCV: 87.1 fL (ref 80.0–100.0)
Monocytes Absolute: 0.8 10*3/uL (ref 0.1–1.0)
Monocytes Relative: 7 %
Neutro Abs: 6.8 10*3/uL (ref 1.7–7.7)
Neutrophils Relative %: 60 %
Platelets: 345 10*3/uL (ref 150–400)
RBC: 5.33 MIL/uL — ABNORMAL HIGH (ref 3.87–5.11)
RDW: 14.6 % (ref 11.5–15.5)
WBC: 11.2 10*3/uL — ABNORMAL HIGH (ref 4.0–10.5)
nRBC: 0 % (ref 0.0–0.2)

## 2022-04-19 LAB — TROPONIN I (HIGH SENSITIVITY): Troponin I (High Sensitivity): 4 ng/L (ref <18)

## 2022-04-19 LAB — LIPASE, BLOOD: Lipase: 23 U/L (ref 11–51)

## 2022-04-19 LAB — D-DIMER, QUANTITATIVE: D-Dimer, Quant: 0.63 ug/mL-FEU — ABNORMAL HIGH (ref 0.00–0.50)

## 2022-04-19 MED ORDER — ASPIRIN 81 MG PO CHEW
324.0000 mg | CHEWABLE_TABLET | Freq: Once | ORAL | Status: AC
  Administered 2022-04-19: 324 mg via ORAL
  Filled 2022-04-19: qty 4

## 2022-04-19 MED ORDER — IOHEXOL 350 MG/ML SOLN
100.0000 mL | Freq: Once | INTRAVENOUS | Status: AC | PRN
  Administered 2022-04-19: 70 mL via INTRAVENOUS

## 2022-04-19 NOTE — ED Notes (Signed)
Pt verbalizes understanding of discharge instructions. Opportunity for questioning and answers were provided. Pt discharged from ED to home with husband.    

## 2022-04-19 NOTE — Discharge Instructions (Signed)
Take 4 over the counter ibuprofen tablets 3 times a day or 2 over-the-counter naproxen tablets twice a day for pain. Also take tylenol 1000mg(2 extra strength) four times a day.    

## 2022-04-19 NOTE — ED Provider Notes (Signed)
[facility] EMERGENCY DEPT Provider Note   CSN: [id number] Arrival date & time: 04/18/22  2345     History  Chief Complaint  Patient presents with   Chest Pain    Rosaisela Neises is a 51 y.o. female.  51 yo F with a chief complaint of chest pain.  This started about 12 hours ago who is left-sided and feels like pins.  Seems to come and go at random.  Not exertional has some shortness of breath and nausea with it.  She denies history of MI denies hypertension hyperlipidemia diabetes or smoking.  She does not know her family history.  She denies history of PE or DVT denies hemoptysis denies unilateral lower extremity edema denies recent surgery immobilization or hospitalization denies history of cancer.  She has a topical estrogen for her vagina that she has not used recently.   Chest Pain      Home Medications Prior to Admission medications   Medication Sig Start Date End Date Taking? Authorizing Provider  acetaminophen (TYLENOL) 500 MG tablet Take 2 tablets (1,000 mg total) by mouth every 8 (eight) hours as needed. 06/04/21   Maczis, Elmer Sow, PA-C  buPROPion (WELLBUTRIN SR) 150 MG 12 hr tablet 1 tablet in the morning    [provider]  cyclobenzaprine (FLEXERIL) 10 MG tablet Take 10 mg by mouth at bedtime as needed. 04/03/22   [provider]  docusate sodium (COLACE) 100 MG capsule Take 1 capsule (100 mg total) by mouth 2 (two) times daily as needed for mild constipation. 06/04/21   Maczis, Elmer Sow, PA-C  Eszopiclone 3 MG TABS Take 3 mg by mouth daily. 04/05/22   [provider]  lamoTRIgine (LAMICTAL) 200 MG tablet Take 200 mg by mouth daily. 03/21/22   [provider]  LATUDA 120 MG TABS Take 120 mg by mouth daily. 05/19/21   [provider]  levothyroxine (SYNTHROID) 50 MCG tablet Take 50 mcg by mouth daily. 05/20/21   [provider]  lurasidone (LATUDA) 80 MG TABS tablet Take 1 tablet (80 mg total) by mouth at  bedtime. 03/17/19   Oneta Rack, NP  meclizine (ANTIVERT) 25 MG tablet Take 25 mg by mouth 3 (three) times daily as needed. 03/30/22   [provider]  omeprazole (PRILOSEC) 40 MG capsule Take 40 mg by mouth every morning. 05/10/21   [provider]  ondansetron (ZOFRAN-ODT) 4 MG disintegrating tablet Take 1 tablet (4 mg total) by mouth every 6 (six) hours as needed for nausea. 06/04/21   Maczis, Elmer Sow, PA-C  polyethylene glycol (MIRALAX / GLYCOLAX) 17 g packet Take 17 g by mouth daily as needed for mild constipation. 06/04/21   Maczis, Elmer Sow, PA-C  sertraline (ZOLOFT) 100 MG tablet Take 200 mg by mouth daily. 05/06/21   [provider]  SUMAtriptan (IMITREX) 50 MG tablet Take 50 mg by mouth daily as needed for migraine. 05/09/21   [provider]  WELLBUTRIN XL 150 MG 24 hr tablet Take 450 mg by mouth daily. 12/02/18   [provider]      Allergies    Risperdal [risperidone], Ziprasidone hcl, and Lithium    Review of Systems   Review of Systems  Cardiovascular:  Positive for chest pain.    Physical Exam Updated Vital Signs BP 115/79   Pulse 91   Temp 99.1 F (37.3 C) (Oral)   Resp 13   Ht 4\' 11"  (1.499 m)   Wt 111.1 kg  LMP 12/25/2018   SpO2 92%   BMI 49.48 kg/m  Physical Exam Vitals and nursing note reviewed.  Constitutional:      General: She is not in acute distress.    Appearance: She is well-developed. She is not diaphoretic.  HENT:     Head: Normocephalic and atraumatic.  Eyes:     Pupils: Pupils are equal, round, and reactive to light.  Cardiovascular:     Rate and Rhythm: Normal rate and regular rhythm.     Heart sounds: No murmur heard.    No friction rub. No gallop.  Pulmonary:     Effort: Pulmonary effort is normal.     Breath sounds: No wheezing or rales.  Chest:     Chest wall: Tenderness (pain along the left anterior chest wall about ribs 3-5 reproduces pain) present.  Abdominal:     General: There is  no distension.     Palpations: Abdomen is soft.     Tenderness: There is no abdominal tenderness.  Musculoskeletal:        General: No tenderness.     Cervical back: Normal range of motion and neck supple.  Skin:    General: Skin is warm and dry.  Neurological:     Mental Status: She is alert and oriented to person, place, and time.  Psychiatric:        Behavior: Behavior normal.     ED Results / Procedures / Treatments   Labs (all labs ordered are listed, but only abnormal results are displayed) Labs Reviewed  CBC WITH DIFFERENTIAL/PLATELET - Abnormal; Notable for the following components:      Result Value   WBC 11.2 (*)    RBC 5.33 (*)    HCT 46.4 (*)    All other components within normal limits  COMPREHENSIVE METABOLIC PANEL - Abnormal; Notable for the following components:   BUN 26 (*)    Total Protein 8.4 (*)    All other components within normal limits  D-DIMER, QUANTITATIVE - Abnormal; Notable for the following components:   D-Dimer, Quant 0.63 (*)    All other components within normal limits  LIPASE, BLOOD  TROPONIN I (HIGH SENSITIVITY)    EKG None  Radiology CT Angio Chest PE W and/or Wo Contrast  Result Date: 04/19/2022 CLINICAL DATA:  Positive D-dimer EXAM: CT ANGIOGRAPHY CHEST WITH CONTRAST TECHNIQUE: Multidetector CT imaging of the chest was performed using the standard protocol during bolus administration of intravenous contrast. Multiplanar CT image reconstructions and MIPs were obtained to evaluate the vascular anatomy. RADIATION DOSE REDUCTION: This exam was performed according to the departmental dose-optimization program which includes automated exposure control, adjustment of the mA and/or kV according to patient size and/or use of iterative reconstruction technique. CONTRAST:  70mL OMNIPAQUE IOHEXOL 350 MG/ML SOLN COMPARISON:  Chest x-ray from earlier in the same day. FINDINGS: Cardiovascular: Thoracic aorta shows no aneurysmal dilatation or dissection.  No cardiac enlargement is seen. Pulmonary artery is well visualized without evidence of pulmonary embolism. Mediastinum/Nodes: Thoracic inlet is within normal limits. No hilar or mediastinal adenopathy is noted. The esophagus as visualized is within normal limits. Lungs/Pleura: The lungs are well aerated bilaterally. No focal infiltrate or sizable effusion is seen. Upper Abdomen: Visualized upper abdomen is unremarkable. Musculoskeletal: Degenerative changes of the thoracic spine are noted. No acute rib abnormality is seen. Review of the MIP images confirms the above findings. IMPRESSION: No evidence of pulmonary emboli.  No acute abnormality noted. Electronically Signed   By: Loraine LericheMark  Lukens M.D.   On: 04/19/2022 01:31   DG Chest Port 1 View  Result Date: 04/19/2022 CLINICAL DATA:  Chest pain and left arm numbness. EXAM: PORTABLE CHEST 1 VIEW COMPARISON:  January 13, 2020 FINDINGS: Multiple overlying radiopaque cardiac lead wires are seen. The heart size and mediastinal contours are within normal limits. Both lungs are clear. Radiopaque surgical clips are seen within the right upper quadrant. The visualized skeletal structures are unremarkable. IMPRESSION: No active cardiopulmonary disease. Electronically Signed   By: Aram Candela M.D.   On: 04/19/2022 00:33    Procedures Procedures    Medications Ordered in ED Medications  aspirin chewable tablet 324 mg (324 mg Oral Given 04/19/22 0024)  iohexol (OMNIPAQUE) 350 MG/ML injection 100 mL (70 mLs Intravenous Contrast Given 04/19/22 0113)    ED Course/ Medical Decision Making/ A&P                           Medical Decision Making Amount and/or Complexity of Data Reviewed Labs: ordered. Radiology: ordered.  Risk OTC drugs. Prescription drug management.   51 yo F with a chief complaints of left-sided chest pain.  Atypical in nature and reproduced on exam.  Going on for about 12 hours now.  Her oxygen saturation is in the low 90s.  I have no  explanation for this as she tells me she is a non-smoker.  Has clear lung sounds for me.  We will obtain a D-dimer 2 troponins lab work.  Not anemic, no electrolyte abnormality, troponin negative.  D-dimer was mildly elevated.  Will obtain a CT angiogram of the chest.  CT scan of the chest is negative for pulmonary embolism or occult pneumonia.  I discussed the results with the patient.  She tells me that the pain has been lasting for greater than 6 hours and denies any significant change she elects to not have a second troponin done.  We will have her follow-up with her family doctor in the office.  1:41 AM:  I have discussed the diagnosis/risks/treatment options with the patient.  Evaluation and diagnostic testing in the emergency department does not suggest an emergent condition requiring admission or immediate intervention beyond what has been performed at this time.  They will follow up with  PCP. We also discussed returning to the ED immediately if new or worsening sx occur. We discussed the sx which are most concerning (e.g., sudden worsening pain, fever, inability to tolerate by mouth) that necessitate immediate return. Medications administered to the patient during their visit and any new prescriptions provided to the patient are listed below.  Medications given during this visit Medications  aspirin chewable tablet 324 mg (324 mg Oral Given 04/19/22 0024)  iohexol (OMNIPAQUE) 350 MG/ML injection 100 mL (70 mLs Intravenous Contrast Given 04/19/22 0113)     The patient appears reasonably screen and/or stabilized for discharge and I doubt any other medical condition or other [facility] requiring further screening, evaluation, or treatment in the ED at this time prior to discharge.          Final Clinical Impression(s) / ED Diagnoses Final diagnoses:  Nonspecific chest pain    Rx / DC Orders ED Discharge Orders     None         Melene Plan, DO 04/19/22 0141
